# Patient Record
Sex: Female | Born: 1980 | Race: Black or African American | Hispanic: No | Marital: Married | Smoking: Never smoker
Health system: Southern US, Community
[De-identification: ages and names within clinical notes are randomized; demographics above are authoritative.]

## PROBLEM LIST (undated history)

## (undated) DIAGNOSIS — O24419 Gestational diabetes mellitus in pregnancy, unspecified control: Secondary | ICD-10-CM

## (undated) DIAGNOSIS — Z8619 Personal history of other infectious and parasitic diseases: Secondary | ICD-10-CM

## (undated) DIAGNOSIS — G43909 Migraine, unspecified, not intractable, without status migrainosus: Secondary | ICD-10-CM

## (undated) DIAGNOSIS — Z21 Asymptomatic human immunodeficiency virus [HIV] infection status: Secondary | ICD-10-CM

## (undated) DIAGNOSIS — B20 Human immunodeficiency virus [HIV] disease: Secondary | ICD-10-CM

## (undated) HISTORY — DX: Asymptomatic human immunodeficiency virus (hiv) infection status: Z21

## (undated) HISTORY — DX: Migraine, unspecified, not intractable, without status migrainosus: G43.909

## (undated) HISTORY — DX: Gestational diabetes mellitus in pregnancy, unspecified control: O24.419

## (undated) HISTORY — DX: Personal history of other infectious and parasitic diseases: Z86.19

## (undated) HISTORY — DX: Human immunodeficiency virus (HIV) disease: B20

## (undated) HISTORY — PX: NO PAST SURGERIES: SHX2092

---

## 2005-06-25 ENCOUNTER — Emergency Department (HOSPITAL_COMMUNITY): Admission: EM | Admit: 2005-06-25 | Discharge: 2005-06-26 | Payer: Self-pay | Admitting: Emergency Medicine

## 2005-07-02 ENCOUNTER — Ambulatory Visit: Payer: Self-pay | Admitting: Sports Medicine

## 2005-10-21 ENCOUNTER — Emergency Department (HOSPITAL_COMMUNITY): Admission: EM | Admit: 2005-10-21 | Discharge: 2005-10-21 | Payer: Self-pay | Admitting: Emergency Medicine

## 2006-05-22 DIAGNOSIS — G43909 Migraine, unspecified, not intractable, without status migrainosus: Secondary | ICD-10-CM | POA: Insufficient documentation

## 2006-09-04 ENCOUNTER — Ambulatory Visit: Payer: Self-pay | Admitting: Obstetrics & Gynecology

## 2006-09-18 ENCOUNTER — Ambulatory Visit: Payer: Self-pay | Admitting: Family Medicine

## 2006-09-19 ENCOUNTER — Ambulatory Visit: Payer: Self-pay | Admitting: Obstetrics and Gynecology

## 2006-09-24 ENCOUNTER — Ambulatory Visit (HOSPITAL_COMMUNITY): Admission: RE | Admit: 2006-09-24 | Discharge: 2006-09-24 | Payer: Self-pay | Admitting: Obstetrics and Gynecology

## 2006-09-25 ENCOUNTER — Ambulatory Visit: Payer: Self-pay | Admitting: Family Medicine

## 2006-10-02 ENCOUNTER — Ambulatory Visit: Payer: Self-pay | Admitting: Family Medicine

## 2006-10-03 ENCOUNTER — Telehealth: Payer: Self-pay | Admitting: Internal Medicine

## 2006-10-16 ENCOUNTER — Ambulatory Visit: Payer: Self-pay | Admitting: Family Medicine

## 2006-10-28 ENCOUNTER — Encounter: Admission: RE | Admit: 2006-10-28 | Discharge: 2006-10-28 | Payer: Self-pay | Admitting: Internal Medicine

## 2006-10-28 ENCOUNTER — Ambulatory Visit: Payer: Self-pay | Admitting: Internal Medicine

## 2006-10-28 DIAGNOSIS — B977 Papillomavirus as the cause of diseases classified elsewhere: Secondary | ICD-10-CM

## 2006-10-28 DIAGNOSIS — B20 Human immunodeficiency virus [HIV] disease: Secondary | ICD-10-CM | POA: Insufficient documentation

## 2006-10-28 DIAGNOSIS — B009 Herpesviral infection, unspecified: Secondary | ICD-10-CM | POA: Insufficient documentation

## 2006-10-28 LAB — CONVERTED CEMR LAB
ALT: 33 units/L (ref 0–35)
AST: 16 units/L (ref 0–37)
Albumin: 3.7 g/dL (ref 3.5–5.2)
Alkaline Phosphatase: 86 units/L (ref 39–117)
BUN: 7 mg/dL (ref 6–23)
Basophils Absolute: 0 10*3/uL (ref 0.0–0.1)
CO2: 23 meq/L (ref 19–32)
Calcium: 8.8 mg/dL (ref 8.4–10.5)
Chloride: 104 meq/L (ref 96–112)
Creatinine, Ser: 0.63 mg/dL (ref 0.40–1.20)
Glucose, Bld: 85 mg/dL (ref 70–99)
HCT: 34.4 % — ABNORMAL LOW (ref 36.0–46.0)
Hemoglobin: 11.4 g/dL — ABNORMAL LOW (ref 12.0–15.0)
Lymphocytes Relative: 17 % (ref 12–46)
MCHC: 33.1 g/dL (ref 30.0–36.0)
MCV: 93.2 fL (ref 78.0–100.0)
Monocytes Relative: 8 % (ref 3–11)
Neutrophils Relative %: 74 % (ref 43–77)
RBC: 3.69 M/uL — ABNORMAL LOW (ref 3.87–5.11)
RDW: 14.2 % — ABNORMAL HIGH (ref 11.5–14.0)
Sodium: 137 meq/L (ref 135–145)
Total Bilirubin: 0.5 mg/dL (ref 0.3–1.2)
Total Protein: 7 g/dL (ref 6.0–8.3)

## 2006-10-29 ENCOUNTER — Encounter: Payer: Self-pay | Admitting: Internal Medicine

## 2006-11-02 ENCOUNTER — Encounter: Payer: Self-pay | Admitting: Internal Medicine

## 2006-11-13 ENCOUNTER — Ambulatory Visit: Payer: Self-pay | Admitting: Obstetrics & Gynecology

## 2006-12-04 ENCOUNTER — Ambulatory Visit: Payer: Self-pay | Admitting: Obstetrics & Gynecology

## 2006-12-08 ENCOUNTER — Ambulatory Visit: Payer: Self-pay | Admitting: Obstetrics & Gynecology

## 2006-12-12 ENCOUNTER — Inpatient Hospital Stay (HOSPITAL_COMMUNITY): Admission: AD | Admit: 2006-12-12 | Discharge: 2006-12-12 | Payer: Self-pay | Admitting: Gynecology

## 2006-12-15 ENCOUNTER — Ambulatory Visit: Payer: Self-pay | Admitting: Obstetrics & Gynecology

## 2006-12-19 ENCOUNTER — Ambulatory Visit: Payer: Self-pay | Admitting: Internal Medicine

## 2006-12-29 ENCOUNTER — Ambulatory Visit (HOSPITAL_COMMUNITY): Admission: RE | Admit: 2006-12-29 | Discharge: 2006-12-29 | Payer: Self-pay | Admitting: Gynecology

## 2006-12-29 ENCOUNTER — Ambulatory Visit: Payer: Self-pay | Admitting: Obstetrics & Gynecology

## 2007-01-02 ENCOUNTER — Ambulatory Visit: Payer: Self-pay | Admitting: Internal Medicine

## 2007-01-08 ENCOUNTER — Ambulatory Visit: Payer: Self-pay | Admitting: Obstetrics & Gynecology

## 2007-01-12 ENCOUNTER — Ambulatory Visit: Payer: Self-pay | Admitting: Obstetrics & Gynecology

## 2007-01-12 ENCOUNTER — Encounter: Admission: RE | Admit: 2007-01-12 | Discharge: 2007-01-12 | Payer: Self-pay | Admitting: Obstetrics & Gynecology

## 2007-01-19 ENCOUNTER — Ambulatory Visit: Payer: Self-pay | Admitting: Family Medicine

## 2007-01-26 ENCOUNTER — Ambulatory Visit: Payer: Self-pay | Admitting: Obstetrics & Gynecology

## 2007-01-27 ENCOUNTER — Ambulatory Visit: Payer: Self-pay | Admitting: Obstetrics and Gynecology

## 2007-01-27 ENCOUNTER — Inpatient Hospital Stay (HOSPITAL_COMMUNITY): Admission: AD | Admit: 2007-01-27 | Discharge: 2007-01-27 | Payer: Self-pay | Admitting: Gynecology

## 2007-02-02 ENCOUNTER — Encounter: Admission: RE | Admit: 2007-02-02 | Discharge: 2007-02-02 | Payer: Self-pay | Admitting: Internal Medicine

## 2007-02-02 ENCOUNTER — Ambulatory Visit: Payer: Self-pay | Admitting: Internal Medicine

## 2007-02-02 ENCOUNTER — Ambulatory Visit: Payer: Self-pay | Admitting: Obstetrics & Gynecology

## 2007-02-02 LAB — CONVERTED CEMR LAB
ALT: 74 units/L — ABNORMAL HIGH (ref 0–35)
AST: 38 units/L — ABNORMAL HIGH (ref 0–37)
Albumin: 3.3 g/dL — ABNORMAL LOW (ref 3.5–5.2)
Alkaline Phosphatase: 208 units/L — ABNORMAL HIGH (ref 39–117)
BUN: 9 mg/dL (ref 6–23)
Basophils Absolute: 0 10*3/uL (ref 0.0–0.1)
CO2: 21 meq/L (ref 19–32)
Chloride: 102 meq/L (ref 96–112)
Eosinophils Relative: 1 % (ref 0–5)
Lymphocytes Relative: 27 % (ref 12–46)
Lymphs Abs: 1.9 10*3/uL (ref 0.7–4.0)
MCHC: 32 g/dL (ref 30.0–36.0)
Monocytes Absolute: 0.7 10*3/uL (ref 0.1–1.0)
Monocytes Relative: 10 % (ref 3–12)
Neutro Abs: 4.5 10*3/uL (ref 1.7–7.7)
Neutrophils Relative %: 62 % (ref 43–77)
Total Bilirubin: 0.4 mg/dL (ref 0.3–1.2)
WBC: 7.2 10*3/uL (ref 4.0–10.5)

## 2007-02-09 ENCOUNTER — Ambulatory Visit (HOSPITAL_COMMUNITY): Admission: RE | Admit: 2007-02-09 | Discharge: 2007-02-09 | Payer: Self-pay | Admitting: Obstetrics and Gynecology

## 2007-02-09 ENCOUNTER — Ambulatory Visit: Payer: Self-pay | Admitting: Obstetrics & Gynecology

## 2007-02-14 ENCOUNTER — Inpatient Hospital Stay (HOSPITAL_COMMUNITY): Admission: AD | Admit: 2007-02-14 | Discharge: 2007-02-16 | Payer: Self-pay | Admitting: Gynecology

## 2007-02-14 ENCOUNTER — Ambulatory Visit: Payer: Self-pay | Admitting: Obstetrics and Gynecology

## 2007-02-18 ENCOUNTER — Ambulatory Visit: Payer: Self-pay | Admitting: Internal Medicine

## 2007-02-20 ENCOUNTER — Emergency Department (HOSPITAL_COMMUNITY): Admission: EM | Admit: 2007-02-20 | Discharge: 2007-02-20 | Payer: Self-pay | Admitting: Family Medicine

## 2007-02-23 ENCOUNTER — Ambulatory Visit: Payer: Self-pay | Admitting: Obstetrics and Gynecology

## 2007-02-23 ENCOUNTER — Inpatient Hospital Stay (HOSPITAL_COMMUNITY): Admission: AD | Admit: 2007-02-23 | Discharge: 2007-02-23 | Payer: Self-pay | Admitting: Obstetrics and Gynecology

## 2007-02-25 ENCOUNTER — Encounter: Payer: Self-pay | Admitting: Internal Medicine

## 2007-03-18 ENCOUNTER — Encounter: Payer: Self-pay | Admitting: Internal Medicine

## 2007-05-06 ENCOUNTER — Ambulatory Visit: Payer: Self-pay | Admitting: Obstetrics & Gynecology

## 2007-05-06 ENCOUNTER — Encounter (INDEPENDENT_AMBULATORY_CARE_PROVIDER_SITE_OTHER): Payer: Self-pay | Admitting: *Deleted

## 2007-05-06 ENCOUNTER — Other Ambulatory Visit: Admission: RE | Admit: 2007-05-06 | Discharge: 2007-05-06 | Payer: Self-pay | Admitting: Family Medicine

## 2007-05-06 ENCOUNTER — Encounter: Payer: Self-pay | Admitting: Obstetrics & Gynecology

## 2007-05-06 ENCOUNTER — Encounter: Payer: Self-pay | Admitting: Internal Medicine

## 2007-05-11 ENCOUNTER — Ambulatory Visit: Payer: Self-pay | Admitting: Internal Medicine

## 2007-05-11 ENCOUNTER — Encounter: Admission: RE | Admit: 2007-05-11 | Discharge: 2007-05-11 | Payer: Self-pay | Admitting: Internal Medicine

## 2007-05-11 LAB — CONVERTED CEMR LAB
ALT: 28 units/L (ref 0–35)
BUN: 10 mg/dL (ref 6–23)
Calcium: 9.2 mg/dL (ref 8.4–10.5)
Chloride: 106 meq/L (ref 96–112)
Creatinine, Ser: 0.79 mg/dL (ref 0.40–1.20)
Eosinophils Relative: 3 % (ref 0–5)
Glucose, Bld: 100 mg/dL — ABNORMAL HIGH (ref 70–99)
HCT: 40.6 % (ref 36.0–46.0)
HIV 1 RNA Quant: 5880 copies/mL — ABNORMAL HIGH (ref <50)
HIV-1 RNA Quant, Log: 3.77 — ABNORMAL HIGH (ref <1.70)
Hemoglobin: 13.2 g/dL (ref 12.0–15.0)
Lymphs Abs: 1.6 10*3/uL (ref 0.7–4.0)
Neutro Abs: 3 10*3/uL (ref 1.7–7.7)
Neutrophils Relative %: 56 % (ref 43–77)
Platelets: 339 10*3/uL (ref 150–400)
Potassium: 4 meq/L (ref 3.5–5.3)
RDW: 13 % (ref 11.5–15.5)
WBC: 5.3 10*3/uL (ref 4.0–10.5)

## 2007-05-19 ENCOUNTER — Encounter (INDEPENDENT_AMBULATORY_CARE_PROVIDER_SITE_OTHER): Payer: Self-pay | Admitting: *Deleted

## 2007-05-22 ENCOUNTER — Ambulatory Visit: Payer: Self-pay | Admitting: Internal Medicine

## 2007-05-22 ENCOUNTER — Encounter: Payer: Self-pay | Admitting: Licensed Clinical Social Worker

## 2007-05-29 ENCOUNTER — Ambulatory Visit: Payer: Self-pay | Admitting: Gynecology

## 2007-08-20 ENCOUNTER — Ambulatory Visit: Payer: Self-pay | Admitting: Internal Medicine

## 2007-08-20 ENCOUNTER — Encounter: Admission: RE | Admit: 2007-08-20 | Discharge: 2007-08-20 | Payer: Self-pay | Admitting: Internal Medicine

## 2007-08-20 LAB — CONVERTED CEMR LAB
AST: 56 units/L — ABNORMAL HIGH (ref 0–37)
Albumin: 4.5 g/dL (ref 3.5–5.2)
Alkaline Phosphatase: 83 units/L (ref 39–117)
CO2: 22 meq/L (ref 19–32)
Calcium: 9.2 mg/dL (ref 8.4–10.5)
Eosinophils Absolute: 0 10*3/uL (ref 0.0–0.7)
Eosinophils Relative: 1 % (ref 0–5)
HCT: 39.9 % (ref 36.0–46.0)
HIV-1 RNA Quant, Log: 3.71 — ABNORMAL HIGH (ref <1.70)
Hemoglobin: 12.4 g/dL (ref 12.0–15.0)
Lymphocytes Relative: 45 % (ref 12–46)
Lymphs Abs: 1.8 10*3/uL (ref 0.7–4.0)
Neutrophils Relative %: 45 % (ref 43–77)
Potassium: 4.2 meq/L (ref 3.5–5.3)
RBC: 4.4 M/uL (ref 3.87–5.11)
Sodium: 137 meq/L (ref 135–145)
Total Protein: 7.9 g/dL (ref 6.0–8.3)
WBC: 4 10*3/uL (ref 4.0–10.5)

## 2007-11-26 ENCOUNTER — Encounter: Payer: Self-pay | Admitting: Internal Medicine

## 2010-12-14 LAB — POCT PREGNANCY, URINE
Operator id: 148111
Preg Test, Ur: NEGATIVE

## 2010-12-31 LAB — URINALYSIS, ROUTINE W REFLEX MICROSCOPIC
Bilirubin Urine: NEGATIVE
Ketones, ur: NEGATIVE
pH: 6

## 2010-12-31 LAB — URINE MICROSCOPIC-ADD ON

## 2011-01-01 LAB — URINE CULTURE
Colony Count: NO GROWTH
Culture: NO GROWTH

## 2011-01-01 LAB — POCT URINALYSIS DIP (DEVICE)
Bilirubin Urine: NEGATIVE
Bilirubin Urine: NEGATIVE
Bilirubin Urine: NEGATIVE
Glucose, UA: NEGATIVE
Glucose, UA: NEGATIVE
Glucose, UA: NEGATIVE
Glucose, UA: NEGATIVE
Hgb urine dipstick: NEGATIVE
Ketones, ur: NEGATIVE
Ketones, ur: NEGATIVE
Nitrite: NEGATIVE
Nitrite: NEGATIVE
Nitrite: NEGATIVE
Operator id: 120861
Operator id: 208841
Operator id: 194561
Operator id: 297281
Protein, ur: 100 — AB
Protein, ur: NEGATIVE
Specific Gravity, Urine: 1.015
Specific Gravity, Urine: 1.015
Urobilinogen, UA: 0.2
Urobilinogen, UA: 1
pH: 6.5

## 2011-01-01 LAB — CBC
Hemoglobin: 10.6 — ABNORMAL LOW
MCV: 95.3
Platelets: 218
RBC: 3.23 — ABNORMAL LOW
RBC: 3.64 — ABNORMAL LOW
RDW: 14.4
WBC: 11.4 — ABNORMAL HIGH

## 2011-01-01 LAB — T-HELPER CELL (CD4) - (RCID CLINIC ONLY)
CD4 % Helper T Cell: 46
CD4 T Cell Abs: 840

## 2011-01-02 LAB — POCT URINALYSIS DIP (DEVICE)
Ketones, ur: NEGATIVE
Ketones, ur: NEGATIVE
Nitrite: NEGATIVE
Nitrite: NEGATIVE
Nitrite: NEGATIVE
Operator id: 134861
Operator id: 297281
Operator id: 297281
Protein, ur: NEGATIVE
Protein, ur: NEGATIVE
Protein, ur: NEGATIVE
Urobilinogen, UA: 0.2
Urobilinogen, UA: 0.2
pH: 7
pH: 7

## 2011-01-03 LAB — URINALYSIS, ROUTINE W REFLEX MICROSCOPIC
Bilirubin Urine: NEGATIVE
Ketones, ur: NEGATIVE
Nitrite: NEGATIVE
Protein, ur: NEGATIVE
pH: 7

## 2011-01-03 LAB — POCT URINALYSIS DIP (DEVICE)
Operator id: 194561
Protein, ur: NEGATIVE
Urobilinogen, UA: 0.2
pH: 7

## 2011-01-03 LAB — URINE MICROSCOPIC-ADD ON: RBC / HPF: NONE SEEN

## 2011-01-04 LAB — POCT URINALYSIS DIP (DEVICE)
Glucose, UA: NEGATIVE
Specific Gravity, Urine: 1.02
Urobilinogen, UA: 1
pH: 6.5

## 2011-01-07 LAB — POCT URINALYSIS DIP (DEVICE)
Nitrite: NEGATIVE
Urobilinogen, UA: 1

## 2011-01-07 LAB — T-HELPER CELL (CD4) - (RCID CLINIC ONLY): CD4 % Helper T Cell: 44

## 2011-01-08 LAB — POCT URINALYSIS DIP (DEVICE)
Glucose, UA: NEGATIVE
Glucose, UA: NEGATIVE
Hgb urine dipstick: NEGATIVE
Nitrite: NEGATIVE
Nitrite: NEGATIVE
Protein, ur: NEGATIVE
Specific Gravity, Urine: 1.01
Urobilinogen, UA: 0.2
Urobilinogen, UA: 1
pH: 5.5

## 2011-01-09 LAB — POCT URINALYSIS DIP (DEVICE)
Nitrite: NEGATIVE
Protein, ur: NEGATIVE
Specific Gravity, Urine: 1.02
Urobilinogen, UA: 0.2
pH: 7

## 2011-01-10 LAB — POCT URINALYSIS DIP (DEVICE)
Nitrite: NEGATIVE
Specific Gravity, Urine: 1.02
Urobilinogen, UA: 1
pH: 6.5

## 2012-04-01 ENCOUNTER — Other Ambulatory Visit (INDEPENDENT_AMBULATORY_CARE_PROVIDER_SITE_OTHER): Payer: BC Managed Care – PPO

## 2012-04-01 ENCOUNTER — Encounter: Payer: Self-pay | Admitting: Internal Medicine

## 2012-04-01 ENCOUNTER — Telehealth: Payer: Self-pay | Admitting: *Deleted

## 2012-04-01 ENCOUNTER — Ambulatory Visit (INDEPENDENT_AMBULATORY_CARE_PROVIDER_SITE_OTHER): Payer: BC Managed Care – PPO | Admitting: Internal Medicine

## 2012-04-01 VITALS — BP 124/86 | HR 80 | Temp 98.1°F | Ht 65.0 in | Wt 144.0 lb

## 2012-04-01 DIAGNOSIS — Z21 Asymptomatic human immunodeficiency virus [HIV] infection status: Secondary | ICD-10-CM

## 2012-04-01 DIAGNOSIS — Z0000 Encounter for general adult medical examination without abnormal findings: Secondary | ICD-10-CM

## 2012-04-01 DIAGNOSIS — Z1329 Encounter for screening for other suspected endocrine disorder: Secondary | ICD-10-CM

## 2012-04-01 DIAGNOSIS — G43009 Migraine without aura, not intractable, without status migrainosus: Secondary | ICD-10-CM

## 2012-04-01 DIAGNOSIS — Z130 Encounter for screening for diseases of the blood and blood-forming organs and certain disorders involving the immune mechanism: Secondary | ICD-10-CM

## 2012-04-01 DIAGNOSIS — Z13220 Encounter for screening for lipoid disorders: Secondary | ICD-10-CM

## 2012-04-01 DIAGNOSIS — Z131 Encounter for screening for diabetes mellitus: Secondary | ICD-10-CM

## 2012-04-01 DIAGNOSIS — B20 Human immunodeficiency virus [HIV] disease: Secondary | ICD-10-CM

## 2012-04-01 LAB — HEMOGLOBIN A1C: Hgb A1c MFr Bld: 5.2 % (ref 4.6–6.5)

## 2012-04-01 LAB — CBC
HCT: 33.9 % — ABNORMAL LOW (ref 36.0–46.0)
Hemoglobin: 11.6 g/dL — ABNORMAL LOW (ref 12.0–15.0)
MCHC: 34.2 g/dL (ref 30.0–36.0)
MCV: 88 fl (ref 78.0–100.0)
Platelets: 279 10*3/uL (ref 150.0–400.0)
RDW: 12.7 % (ref 11.5–14.6)

## 2012-04-01 LAB — BASIC METABOLIC PANEL
BUN: 10 mg/dL (ref 6–23)
Calcium: 8.8 mg/dL (ref 8.4–10.5)
Chloride: 101 mEq/L (ref 96–112)
Creatinine, Ser: 0.6 mg/dL (ref 0.4–1.2)

## 2012-04-01 LAB — LIPID PANEL
Cholesterol: 155 mg/dL (ref 0–200)
LDL Cholesterol: 88 mg/dL (ref 0–99)
Triglycerides: 112 mg/dL (ref 0.0–149.0)

## 2012-04-01 MED ORDER — SUMATRIPTAN SUCCINATE 50 MG PO TABS: 50.0000 mg | ORAL_TABLET | ORAL | Status: DC | PRN

## 2012-04-01 NOTE — Telephone Encounter (Signed)
Pt informed of lab results via VM and to callback office with any questions/concerns (letter also mailed to pt).     Ash,  Can you please send this to Mrs. Lacap and also call and let her know that all of her labs were essentially normal.  Rene Kocher

## 2012-04-01 NOTE — Patient Instructions (Signed)
Health Maintenance, Females A healthy lifestyle and preventative care can promote health and wellness.  Maintain regular health, dental, and eye exams.  Eat a healthy diet. Foods like vegetables, fruits, whole grains, low-fat dairy products, and lean protein foods contain the nutrients you need without too many calories. Decrease your intake of foods high in solid fats, added sugars, and salt. Get information about a proper diet from your caregiver, if necessary.  Regular physical exercise is one of the most important things you can do for your health. Most adults should get at least 150 minutes of moderate-intensity exercise (any activity that increases your heart rate and causes you to sweat) each week. In addition, most adults need muscle-strengthening exercises on 2 or more days a week.   Maintain a healthy weight. The body mass index (BMI) is a screening tool to identify possible weight problems. It provides an estimate of body fat based on height and weight. Your caregiver can help determine your BMI, and can help you achieve or maintain a healthy weight. For adults 20 years and older:  A BMI below 18.5 is considered underweight.  A BMI of 18.5 to 24.9 is normal.  A BMI of 25 to 29.9 is considered overweight.  A BMI of 30 and above is considered obese.  Maintain normal blood lipids and cholesterol by exercising and minimizing your intake of saturated fat. Eat a balanced diet with plenty of fruits and vegetables. Blood tests for lipids and cholesterol should begin at age 20 and be repeated every 5 years. If your lipid or cholesterol levels are high, you are over 50, or you are a high risk for heart disease, you may need your cholesterol levels checked more frequently.Ongoing high lipid and cholesterol levels should be treated with medicines if diet and exercise are not effective.  If you smoke, find out from your caregiver how to quit. If you do not use tobacco, do not start.  If you  are pregnant, do not drink alcohol. If you are breastfeeding, be very cautious about drinking alcohol. If you are not pregnant and choose to drink alcohol, do not exceed 1 drink per day. One drink is considered to be 12 ounces (355 mL) of beer, 5 ounces (148 mL) of wine, or 1.5 ounces (44 mL) of liquor.  Avoid use of street drugs. Do not share needles with anyone. Ask for help if you need support or instructions about stopping the use of drugs.  High blood pressure causes heart disease and increases the risk of stroke. Blood pressure should be checked at least every 1 to 2 years. Ongoing high blood pressure should be treated with medicines, if weight loss and exercise are not effective.  If you are 55 to 30 years old, ask your caregiver if you should take aspirin to prevent strokes.  Diabetes screening involves taking a blood sample to check your fasting blood sugar level. This should be done once every 3 years, after age 45, if you are within normal weight and without risk factors for diabetes. Testing should be considered at a younger age or be carried out more frequently if you are overweight and have at least 1 risk factor for diabetes.  Breast cancer screening is essential preventative care for women. You should practice "breast self-awareness." This means understanding the normal appearance and feel of your breasts and may include breast self-examination. Any changes detected, no matter how small, should be reported to a caregiver. Women in their 20s and 30s should have   a clinical breast exam (CBE) by a caregiver as part of a regular health exam every 1 to 3 years. After age 40, women should have a CBE every year. Starting at age 40, women should consider having a mammogram (breast X-ray) every year. Women who have a family history of breast cancer should talk to their caregiver about genetic screening. Women at a high risk of breast cancer should talk to their caregiver about having an MRI and a  mammogram every year.  The Pap test is a screening test for cervical cancer. Women should have a Pap test starting at age 21. Between ages 21 and 29, Pap tests should be repeated every 2 years. Beginning at age 30, you should have a Pap test every 3 years as long as the past 3 Pap tests have been normal. If you had a hysterectomy for a problem that was not cancer or a condition that could lead to cancer, then you no longer need Pap tests. If you are between ages 65 and 70, and you have had normal Pap tests going back 10 years, you no longer need Pap tests. If you have had past treatment for cervical cancer or a condition that could lead to cancer, you need Pap tests and screening for cancer for at least 20 years after your treatment. If Pap tests have been discontinued, risk factors (such as a new sexual partner) need to be reassessed to determine if screening should be resumed. Some women have medical problems that increase the chance of getting cervical cancer. In these cases, your caregiver may recommend more frequent screening and Pap tests.  The human papillomavirus (HPV) test is an additional test that may be used for cervical cancer screening. The HPV test looks for the virus that can cause the cell changes on the cervix. The cells collected during the Pap test can be tested for HPV. The HPV test could be used to screen women aged 30 years and older, and should be used in women of any age who have unclear Pap test results. After the age of 30, women should have HPV testing at the same frequency as a Pap test.  Colorectal cancer can be detected and often prevented. Most routine colorectal cancer screening begins at the age of 50 and continues through age 75. However, your caregiver may recommend screening at an earlier age if you have risk factors for colon cancer. On a yearly basis, your caregiver may provide home test kits to check for hidden blood in the stool. Use of a small camera at the end of a  tube, to directly examine the colon (sigmoidoscopy or colonoscopy), can detect the earliest forms of colorectal cancer. Talk to your caregiver about this at age 50, when routine screening begins. Direct examination of the colon should be repeated every 5 to 10 years through age 75, unless early forms of pre-cancerous polyps or small growths are found.  Hepatitis C blood testing is recommended for all people born from 1945 through 1965 and any individual with known risks for hepatitis C.  Practice safe sex. Use condoms and avoid high-risk sexual practices to reduce the spread of sexually transmitted infections (STIs). Sexually active women aged 25 and younger should be checked for Chlamydia, which is a common sexually transmitted infection. Older women with new or multiple partners should also be tested for Chlamydia. Testing for other STIs is recommended if you are sexually active and at increased risk.  Osteoporosis is a disease in which the   bones lose minerals and strength with aging. This can result in serious bone fractures. The risk of osteoporosis can be identified using a bone density scan. Women ages 65 and over and women at risk for fractures or osteoporosis should discuss screening with their caregivers. Ask your caregiver whether you should be taking a calcium supplement or vitamin D to reduce the rate of osteoporosis.  Menopause can be associated with physical symptoms and risks. Hormone replacement therapy is available to decrease symptoms and risks. You should talk to your caregiver about whether hormone replacement therapy is right for you.  Use sunscreen with a sun protection factor (SPF) of 30 or greater. Apply sunscreen liberally and repeatedly throughout the day. You should seek shade when your shadow is shorter than you. Protect yourself by wearing long sleeves, pants, a wide-brimmed hat, and sunglasses year round, whenever you are outdoors.  Notify your caregiver of new moles or  changes in moles, especially if there is a change in shape or color. Also notify your caregiver if a mole is larger than the size of a pencil eraser.  Stay current with your immunizations. Document Released: 09/24/2010 Document Revised: 06/03/2011 Document Reviewed: 09/24/2010 ExitCare Patient Information 2013 [employer]. Breast Self-Exam A self breast exam may help you find changes or problems while they are still small. Do a breast self-exam:  Every month.  One week after your period (menstrual period).  On the first day of each month if you do not have periods anymore. Look for any:  Change in breast color, size, or shape.  Dimples in your breast.  Changes in your nipples or skin.  Dry skin on your breasts or nipples.  Watery or bloody discharge from your nipples.  Feel for:  Lumps.  Thick, hard places.  Any other changes. HOME CARE There are 3 ways to do the breast self-exam: In front of a mirror.  Lift your arms over your head and turn side to side.  Put your hands on your hips and lean down, then turn from side to side.  Bend forward and turn from side to side. In the shower.  With soapy hands, check both breasts. Then check above and below your collarbone and your armpits.  Feel above and below your collarbone down to under your breast, and from the center of your chest to the outer edge of the armpit. Check for any lumps or hard spots.  Using the tips of your middle three fingers check your whole breast by pressing your hand over your breast in a circle or in an up and down motion. Lying down.  Lie flat on your bed.  Put a small pillow under the breast you are going to check. On that same side, put your hand behind your head.  With your other hand, use the 3 middle fingers to feel the breast.  Move your fingers in a circle around the breast. Press firmly over all parts of the breast to feel for any lumps. GET HELP RIGHT AWAY IF: You find any  changes in your breasts so they can be checked. Document Released: 08/28/2007 Document Revised: 06/03/2011 Document Reviewed: 06/29/2008 Exit[employer] Patient Information 2013 [employer].  

## 2012-04-01 NOTE — Progress Notes (Signed)
HPI  Pt presents to the clinic today to establish care. Diane Lowery is accompanied by her husband who is her primary support person. Diane Lowery has not seen a PCP in a number of years. Diane Lowery does have some concerns today. 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 Diane Lowery needs to get set up with an ID doctor in this area who can check her labs and let her know if Diane Lowery needs to start antiviral treatment. 2- Diane Lowery has been having headaches every day since Mar 02, 2012. Diane Lowery has gone to UC and the ER for this. They did a CT scan which was negative. They gave her Valium, which did not help. The headache are on the right side of her head only. They start in her neck and work there way up. Diane Lowery does have pain behind her right eye. Diane Lowery does have right eye watering and right nares rinorrhea. Diane Lowery does not have an aura. Diane Lowery does have nausea without vomiting. Diane Lowery uses Bayer Migraine and cold compresses and sleeps it off which seems to help.  LMP: 03/21/2012 Pap Smear: unknown when last one was Flu: never Tdap: never  Past Medical History  Diagnosis Date  . Migraines   . HIV infection   . History of chicken pox     No current outpatient prescriptions on file.    No Known Allergies  Family History  Problem Relation Age of Onset  . Stroke Maternal Aunt   . Hypertension Mother   . Hypertension Sister   . Cancer Neg Hx   . Diabetes Neg Hx   . Early death Neg Hx   . Hyperlipidemia Neg Hx     History   Social History  . Marital Status: Married    Spouse Name: N/A    Number of Children: 1  . Years of Education: 12+   Occupational History  . Customer Service    Social History Main Topics  . Smoking status: Never Smoker   . Smokeless tobacco: Never Used  . Alcohol Use: 1.8 oz/week    1 Glasses of wine, 1 Cans of beer, 1 Shots of liquor per week  . Drug Use: No  . Sexually Active: Yes    Birth Control/ Protection: None   Other Topics Concern  . Not on file   Social History  Narrative   Regular exercise-noCaffeine Use-yes    ROS:  Constitutional: Pt reports headache. Denies fever, malaise, fatigue, or abrupt weight changes.  HEENT: Denies eye pain, eye redness, ear pain, ringing in the ears, wax buildup, runny nose, nasal congestion, bloody nose, or sore throat. Respiratory: Denies difficulty breathing, shortness of breath, cough or sputum production.   Cardiovascular: Denies chest pain, chest tightness, palpitations or swelling in the hands or feet.  Gastrointestinal: Denies abdominal pain, bloating, constipation, diarrhea or blood in the stool.  GU: Denies frequency, urgency, pain with urination, blood in urine, odor or discharge. Musculoskeletal: Denies decrease in range of motion, difficulty with gait, muscle pain or joint pain and swelling.  Skin: Denies redness, rashes, lesions or ulcercations.  Neurological: Denies dizziness, difficulty with memory, difficulty with speech or problems with balance and coordination.   No other specific complaints in a complete review of systems (except as listed in HPI above).  PE:  BP 124/86  Pulse 80  Temp 98.1 F (36.7 C) (Oral)  Ht 5\' 5"  (1.651 m)  Wt 144 lb (65.318 kg)  BMI 23.96 kg/m2  SpO2 99%  LMP 03/21/2012 Wt Readings from Last 3 Encounters:  04/01/12 144 lb (65.318 kg)  05/22/07 154 lb 6.4 oz (70.035 kg)  02/18/07 165 lb 1 oz (74.872 kg)    General: Appears her stated age, well developed, well nourished in NAD. HEENT: Head: normal shape and size; Eyes: sclera white, no icterus, conjunctiva pink, PERRLA and EOMs intact; Ears: Tm's gray and intact, normal light reflex; Nose: mucosa pink and moist, septum midline; Throat/Mouth: Teeth present, mucosa pink and moist, no lesions or ulcerations noted.  Neck: Normal range of motion. Neck supple, trachea midline. No massses, lumps or thyromegaly present.  Cardiovascular: Normal rate and rhythm. S1,S2 noted.  No murmur, rubs or gallops noted. No JVD or BLE  edema. No carotid bruits noted. Pulmonary/Chest: Normal effort and positive vesicular breath sounds. No respiratory distress. No wheezes, rales or ronchi noted.  Abdomen: Soft and nontender. Normal bowel sounds, no bruits noted. No distention or masses noted. Liver, spleen and kidneys non palpable. Musculoskeletal: Normal range of motion. No signs of joint swelling. No difficulty with gait.  Neurological: Alert and oriented. Cranial nerves II-XII intact. Coordination normal. +DTRs bilaterally. Psychiatric: Mood and affect normal. Behavior is normal. Judgment and thought content normal.    Assessment and Plan:  Preventative health Maintenance:  Pt declines flu and Tdap today Continue diet and exercise Will obtain basic screening labs today Call gyn and make appoint for pap smear  Migraine without aura:  Try Imitrex May need referral to neurology for further evaluation  HIV:  Will refer to ID for further workup and management  RTC in 1 year or sooner if needed

## 2012-04-16 ENCOUNTER — Ambulatory Visit (INDEPENDENT_AMBULATORY_CARE_PROVIDER_SITE_OTHER): Payer: BC Managed Care – PPO

## 2012-04-16 ENCOUNTER — Other Ambulatory Visit: Payer: Self-pay | Admitting: Internal Medicine

## 2012-04-16 DIAGNOSIS — F329 Major depressive disorder, single episode, unspecified: Secondary | ICD-10-CM

## 2012-04-16 DIAGNOSIS — Z23 Encounter for immunization: Secondary | ICD-10-CM

## 2012-04-16 DIAGNOSIS — B20 Human immunodeficiency virus [HIV] disease: Secondary | ICD-10-CM

## 2012-04-16 DIAGNOSIS — N979 Female infertility, unspecified: Secondary | ICD-10-CM

## 2012-04-16 LAB — COMPLETE METABOLIC PANEL WITH GFR
ALT: 39 U/L — ABNORMAL HIGH (ref 0–35)
AST: 36 U/L (ref 0–37)
Albumin: 4.2 g/dL (ref 3.5–5.2)
Calcium: 9 mg/dL (ref 8.4–10.5)
Chloride: 100 mEq/L (ref 96–112)
Potassium: 4 mEq/L (ref 3.5–5.3)
Sodium: 135 mEq/L (ref 135–145)

## 2012-04-16 LAB — HEPATITIS B SURFACE ANTIGEN: Hepatitis B Surface Ag: NEGATIVE

## 2012-04-16 LAB — RPR

## 2012-04-17 LAB — URINALYSIS
Leukocytes, UA: NEGATIVE
Nitrite: POSITIVE — AB
Protein, ur: NEGATIVE mg/dL
pH: 7 (ref 5.0–8.0)

## 2012-04-17 LAB — HEPATITIS B CORE ANTIBODY, TOTAL: Hep B Core Total Ab: NEGATIVE

## 2012-04-17 LAB — HEPATITIS A ANTIBODY, TOTAL: Hep A Total Ab: POSITIVE — AB

## 2012-04-17 LAB — HEPATITIS B SURFACE ANTIBODY,QUALITATIVE: Hep B S Ab: REACTIVE — AB

## 2012-04-20 NOTE — Progress Notes (Signed)
Pt has not been in care for HIV since birth of her daughter in 2008.  She refuses to take medications and thinks it is a reminder of her HIV. She admits being severely depressed due to this diagnosis and she would rather let the disease take its course rather than  receive treatment.  Patient states the stigma of HIV has such a depressing effect on her life. She does not feel motivated to do anything . When I asked about living for her daughter she replied "she would be well taken care of". She stated she would not harm herself or others but just would not do anything to prevent her death.   She states she is trying to conceive since she is newly married and her husband wants a child.  I asked why she would have another child if she did not feel like living and she stated because her husband wanted this. She does not want to loose her husband since he is the financial provider for her family. She never thought she would find a man to marry her with HIV and thinks  it would be hard to find someone else.  We discussed the issue of her taking HIV medications while she is pregnant and she stated she would take it during this period.   She has me very  concerned  and I do not feel comfortable allowing her to leave without speaking with our counselor.  I spent the majority of intake discussing her thoughts and feeling of being HIV positive and her reasons refusing treatment.   Her reason for being here today is due to the referral made by  Nicki Reaper, NP for treatment.  I feel she is seeking help with this problem and will assist with resources to provide positive reinforcement/support  for patient.  Pt is in agreement with the need for psychological assistance.    Franne Forts, counselor met with her briefly and scheduled an appointment for counseling on Apr 23, 2012.  After a discussion with Franne Forts I feel she is safe to be released.  Pt did commit to returning for counseling and appointment with physician.      Diane Lowery was a patient here during her pregnancy in 2008. Her name at the time was Diane Lowery.  Laurell Josephs, RN

## 2012-04-22 LAB — HIV-1 GENOTYPR PLUS

## 2012-04-23 ENCOUNTER — Ambulatory Visit: Payer: BC Managed Care – PPO

## 2012-04-30 ENCOUNTER — Ambulatory Visit (INDEPENDENT_AMBULATORY_CARE_PROVIDER_SITE_OTHER): Payer: BC Managed Care – PPO | Admitting: Internal Medicine

## 2012-04-30 ENCOUNTER — Encounter: Payer: Self-pay | Admitting: Internal Medicine

## 2012-04-30 VITALS — BP 136/84 | HR 83 | Temp 97.8°F | Wt 145.0 lb

## 2012-04-30 DIAGNOSIS — B20 Human immunodeficiency virus [HIV] disease: Secondary | ICD-10-CM

## 2012-04-30 DIAGNOSIS — Z21 Asymptomatic human immunodeficiency virus [HIV] infection status: Secondary | ICD-10-CM

## 2012-04-30 MED ORDER — SULFAMETHOXAZOLE-TMP DS 800-160 MG PO TABS: 1.0000 | ORAL_TABLET | Freq: Every day | ORAL | Status: DC

## 2012-04-30 MED ORDER — ELVITEG-COBIC-EMTRICIT-TENOFDF 150-150-200-300 MG PO TABS: 1.0000 | ORAL_TABLET | Freq: Every day | ORAL | Status: DC

## 2012-04-30 NOTE — Progress Notes (Signed)
[facility] NOTE  RFV: re-establishing care Subjective:    Patient ID: Diane Lowery, female    DOB: 07-29-1980, 32 y.o.   MRN: [id number]  HPI 32 yo F with HIV, CD 4 count of 140(24%)/VL 83,543 ( Jan 2014), has not been on cART since pregnancy in 2008, last #s in 2009 show CD 4 count 700s. Presents back in clinic to re-establish care. Moved back to the area last June 2013, with 5 yr old daughter to have her closer to her  daughter's birth-father. Still close to the family and her daughter's father. She is recently married in Sept - husband resides in [place] outside of [place]. When asked why she is getting back to care, her response,"my husband wanted me to". She and her new husband are trying to procreate in hopes of having a child.  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 She is currently waiting on unemployment.   Dx in 2007; decided to get tested ,in setting of donating plasma Started cART in 2008, in setting of pregnancy, daughter: HIV negative, finished  Taking cART a few weeks after giving birth  Current Outpatient Prescriptions on File Prior to Visit  Medication Sig Dispense Refill  . SUMAtriptan (IMITREX) 50 MG tablet Take 1 tablet (50 mg total) by mouth every 2 (two) hours as needed for migraine.  30 tablet  1   No current facility-administered medications on file prior to visit.   Active Ambulatory Problems    Diagnosis Date Noted  . HIV DISEASE 10/28/2006  . HSV 10/28/2006  . 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 MIGRAINE, UNSPEC., W/O INTRACTABLE MIGRAINE 05/22/2006   Resolved Ambulatory Problems    Diagnosis Date Noted  . No Resolved Ambulatory Problems   Past Medical History  Diagnosis Date  . Migraines   . HIV infection   . History of chicken pox      Review of Systems     Objective:   Physical Exam BP 136/84  Pulse 83  Temp 97.8 F (36.6 C) (Oral)  Wt 145 lb (65.772 kg)  LMP 04/15/2012 Physical Exam   Constitutional:  oriented to person, place, and time.  appears well-developed and well-nourished. No distress.  HENT:  Mouth/Throat: Oropharynx is clear and moist. No oropharyngeal exudate.  Cardiovascular: Normal rate, regular rhythm and normal heart sounds. Exam reveals no gallop and no friction rub.  No murmur heard.  Pulmonary/Chest: Effort normal and breath sounds normal. No respiratory distress. He has no wheezes.  Abdominal: Soft. Bowel sounds are normal. He exhibits no distension. There is no tenderness.  Lymphadenopathy:  has no cervical adenopathy.  Skin: Skin is warm and dry. No rash noted. No erythema.          Assessment & Plan:  HIV = will start her on stribild  OI proph = will start her on bactrim DS M-W-F  Health maintenance = up to date on vaccines  hiv prevention = use condoms, discussed that getting pregnant right now is very high risk for her husband getting hiv as well as concern that she is not on treatment and can have perinatal transmission. She agreed to start medication and discuss next steps at next appt.  Depression = she likely suffers from depression, would like to refer her to [facility] to start counseling. Discuss use of anti depressants at next visit  rtc in 1 mon

## 2012-05-28 ENCOUNTER — Encounter: Payer: Self-pay | Admitting: Internal Medicine

## 2012-05-28 ENCOUNTER — Ambulatory Visit (INDEPENDENT_AMBULATORY_CARE_PROVIDER_SITE_OTHER): Payer: BC Managed Care – PPO | Admitting: Internal Medicine

## 2012-05-28 VITALS — BP 121/80 | HR 76 | Temp 97.9°F | Ht 65.0 in | Wt 147.0 lb

## 2012-05-28 DIAGNOSIS — F329 Major depressive disorder, single episode, unspecified: Secondary | ICD-10-CM

## 2012-05-28 DIAGNOSIS — F3289 Other specified depressive episodes: Secondary | ICD-10-CM

## 2012-05-28 DIAGNOSIS — B20 Human immunodeficiency virus [HIV] disease: Secondary | ICD-10-CM

## 2012-05-28 LAB — CBC WITH DIFFERENTIAL/PLATELET
Basophils Relative: 1 % (ref 0–1)
Eosinophils Absolute: 0.6 10*3/uL (ref 0.0–0.7)
Eosinophils Relative: 15 % — ABNORMAL HIGH (ref 0–5)
HCT: 34.1 % — ABNORMAL LOW (ref 36.0–46.0)
Hemoglobin: 11.5 g/dL — ABNORMAL LOW (ref 12.0–15.0)
Lymphs Abs: 0.8 10*3/uL (ref 0.7–4.0)
Neutro Abs: 1.9 10*3/uL (ref 1.7–7.7)
Neutrophils Relative %: 51 % (ref 43–77)
RDW: 14 % (ref 11.5–15.5)
WBC: 3.8 10*3/uL — ABNORMAL LOW (ref 4.0–10.5)

## 2012-05-28 LAB — COMPREHENSIVE METABOLIC PANEL
ALT: 15 U/L (ref 0–35)
Alkaline Phosphatase: 56 U/L (ref 39–117)
CO2: 28 mEq/L (ref 19–32)
Creat: 0.8 mg/dL (ref 0.50–1.10)
Glucose, Bld: 81 mg/dL (ref 70–99)
Total Bilirubin: 0.3 mg/dL (ref 0.3–1.2)

## 2012-05-28 NOTE — Progress Notes (Signed)
[facility] NOTE  RFV: 1 month follow up appointment Subjective:    Patient ID: Diane Lowery, female    DOB: 1981/01/20, 32 y.o.   MRN: [id number]  HPI  31 yo F with HIV, CD 4 count of 140(24%)/VL 83,543 ( Jan 2014), started stribild on 2/6. Also has been on bactrim OI proph. Only missed 1 day of meds. But doing well overall.   has not been on cART since pregnancy in 2008, last #s in 2009 show CD 4 count 700s. Presents back in clinic to re-establish care.  Moved back to the area last June 2013, with 5 yr old daughter to have her closer to her daughter's birth-father. Still close to the family and her daughter's father. She is recently married in Sept - husband resides in [place] outside of [place]. When asked why she is getting back to care, her response,"my husband wanted me to". She and her new husband are trying to procreate in hopes of having a child.  Current Outpatient Prescriptions on File Prior to Visit  Medication Sig Dispense Refill  . elvitegravir-cobicistat-emtricitabine-tenofovir (STRIBILD) 150-150-200-300 MG TABS Take 1 tablet by mouth daily with breakfast.  30 tablet  11  . sulfamethoxazole-trimethoprim (BACTRIM DS) 800-160 MG per tablet Take 1 tablet by mouth daily.  30 tablet  5  . SUMAtriptan (IMITREX) 50 MG tablet Take 1 tablet (50 mg total) by mouth every 2 (two) hours as needed for migraine.  30 tablet  1   No current facility-administered medications on file prior to visit.    Review of Systems 10 point ROS negative other than self conscious about rash/scarring to chest    Objective:   Physical Exam BP 121/80  Pulse 76  Temp(Src) 97.9 F (36.6 C)  Ht 5\' 5"  (1.651 m)  Wt 147 lb (66.679 kg)  BMI 24.46 kg/m2  LMP 05/14/2012 Physical Exam  Constitutional: oriented to person, place, and time.  appears well-developed and well-nourished. No distress.  HENT:  Mouth/Throat: Oropharynx is clear and moist. No oropharyngeal exudate.  Cardiovascular: Normal  rate, regular rhythm and normal heart sounds. Exam reveals no gallop and no friction rub.  No murmur heard.  Pulmonary/Chest: Effort normal and breath sounds normal. No respiratory distress. He has no wheezes.  Lymphadenopathy:   no cervical adenopathy.  Skin: Skin is warm and dry. Scattered lesions c/w folliculitis          Assessment & Plan:   hiv = continue stribild, will check labs today  oi proph = continue bactrim DS daily  Health maintenance = non need for vaccines at this time  hiv prevention = husband doesn't want to use condoms. Recommended that he calls me to discuss the importance of using condoms at least until she is undetectable  Depression = feels ok with just talking to her husband. Does not want to start counseling or ssri at this time. rtc in 2 months

## 2012-05-29 LAB — T-HELPER CELL (CD4) - (RCID CLINIC ONLY)
CD4 % Helper T Cell: 26 % — ABNORMAL LOW (ref 33–55)
CD4 T Cell Abs: 200 uL — ABNORMAL LOW (ref 400–2700)

## 2012-05-30 LAB — HIV-1 RNA QUANT-NO REFLEX-BLD
HIV 1 RNA Quant: 59 copies/mL — ABNORMAL HIGH (ref <20)
HIV-1 RNA Quant, Log: 1.77 {Log} — ABNORMAL HIGH (ref <1.30)

## 2012-06-10 ENCOUNTER — Other Ambulatory Visit: Payer: Self-pay | Admitting: Internal Medicine

## 2012-06-10 ENCOUNTER — Encounter: Payer: Self-pay | Admitting: Internal Medicine

## 2012-06-10 ENCOUNTER — Telehealth: Payer: Self-pay | Admitting: *Deleted

## 2012-06-10 ENCOUNTER — Other Ambulatory Visit: Payer: BC Managed Care – PPO

## 2012-06-10 NOTE — Telephone Encounter (Signed)
Patient sent email stating she had a positive home pregnancy test and wanted to know if she can stay on Stribild. Wendall Mola

## 2012-06-11 ENCOUNTER — Encounter: Payer: Self-pay | Admitting: Internal Medicine

## 2012-06-11 ENCOUNTER — Other Ambulatory Visit (HOSPITAL_COMMUNITY)
Admission: RE | Admit: 2012-06-11 | Discharge: 2012-06-11 | Disposition: A | Discharge status: Home / Self Care | Payer: BC Managed Care – PPO | Source: Ambulatory Visit | Attending: Internal Medicine | Admitting: Internal Medicine

## 2012-06-11 ENCOUNTER — Ambulatory Visit (INDEPENDENT_AMBULATORY_CARE_PROVIDER_SITE_OTHER): Payer: BC Managed Care – PPO | Admitting: Internal Medicine

## 2012-06-11 ENCOUNTER — Ambulatory Visit: Payer: BC Managed Care – PPO | Admitting: Internal Medicine

## 2012-06-11 VITALS — BP 122/70 | HR 94 | Temp 98.0°F

## 2012-06-11 DIAGNOSIS — Z01419 Encounter for gynecological examination (general) (routine) without abnormal findings: Secondary | ICD-10-CM | POA: Insufficient documentation

## 2012-06-11 DIAGNOSIS — Z124 Encounter for screening for malignant neoplasm of cervix: Secondary | ICD-10-CM

## 2012-06-11 DIAGNOSIS — Z23 Encounter for immunization: Secondary | ICD-10-CM

## 2012-06-11 NOTE — Addendum Note (Signed)
Addended by: Carin Primrose on: 06/11/2012 02:20 PM   Modules accepted: Orders

## 2012-06-11 NOTE — Progress Notes (Signed)
[facility]   RFV: missed menses, positive urine pregnancy test Subjective:    Patient ID: Diane Lowery, female    DOB: [birth year], 32 y.o.   MRN: [id number]  HPI 32 yo F with HIV, CD4 count 200 (24%)/VL 59, stribild x 1 monthPatient seen last on 3/6 after 1 month stribild, did well missed one dose. Good viral suppression, at that visit, she reported unprotected sex at day 15-17 of her menstrual cycle. We did urine pregnancy test which was negative at last vist. She repeated urine test at home which was positive over the weekend. We had her come in to confirm urine preg test. Blood test is positive as well. She is started on pre-natal multivitamins. LMP 2/28, she recalls. Possibly now at [redacted] wks gestation Current Outpatient Prescriptions on File Prior to Visit  Medication Sig Dispense Refill  . elvitegravir-cobicistat-emtricitabine-tenofovir (STRIBILD) 150-150-200-300 MG TABS Take 1 tablet by mouth daily with breakfast.  30 tablet  11  . sulfamethoxazole-trimethoprim (BACTRIM DS) 800-160 MG per tablet Take 1 tablet by mouth daily.  30 tablet  5  . SUMAtriptan (IMITREX) 50 MG tablet Take 1 tablet (50 mg total) by mouth every 2 (two) hours as needed for migraine.  30 tablet  1   No current facility-administered medications on file prior to visit.     Review of Systems  Constitutional: Negative for fever, chills, diaphoresis, activity change, appetite change, fatigue and unexpected weight change.  HENT: Negative for congestion, sore throat, rhinorrhea, sneezing, trouble swallowing and sinus pressure.  Eyes: Negative for photophobia and visual disturbance.  Respiratory: Negative for cough, chest tightness, shortness of breath, wheezing and stridor.  Cardiovascular: Negative for chest pain, palpitations and leg swelling.  Gastrointestinal: Negative for nausea, vomiting, abdominal pain, diarrhea, constipation, blood in stool, abdominal distention and anal bleeding.  Genitourinary: Negative  for dysuria, hematuria, flank pain and difficulty urinating.  Musculoskeletal: Negative for myalgias, back pain, joint swelling, arthralgias and gait problem.  Skin: Negative for color change, pallor, rash and wound.  Neurological: Negative for dizziness, tremors, weakness and light-headedness.  Hematological: Negative for adenopathy. Does not bruise/bleed easily.  Psychiatric/Behavioral: Negative for behavioral problems, confusion, sleep disturbance, dysphoric mood, decreased concentration and agitation.       Objective:   Physical Exam LMP 05/14/2012 Physical Exam  Constitutional:  oriented to person, place, and time.  appears well-developed and well-nourished. No distress.  HENT:  Mouth/Throat: Oropharynx is clear and moist. No oropharyngeal exudate.  Cardiovascular: Normal rate, regular rhythm and normal heart sounds. Exam reveals no gallop and no friction rub.  No murmur heard.  Pulmonary/Chest: Effort normal and breath sounds normal. No respiratory distress. He has no wheezes.  Abdominal: Soft. Bowel sounds are normal. exhibits no distension. There is no tenderness.  Lymphadenopathy: no cervical adenopathy.  Skin: Skin is warm and dry. No rash noted. No erythema.  Psychiatric:  normal mood and affect. behavior is normal.          Assessment & Plan:  hiv and 1st trimester pregnancy = called [facility] hotline to weigh in on keeping on stribild vs.  Switching to truvada/ATV 300/rit 100, with anticipation to increase to ATV 400/rit 100 during 2nd-3rd trimester. Little data on women on stribild, likely safe but not well known in regards to cobicistat. Patient is willing to change over to truvada/ATVr for duration of pregnancy  oi proph = currently on bactrim. Her CD 4 count at 200 today, less likely to have PCP.  Migraine = recommend to  stop taking imitrex while pregnant; can only take tylenol  Pregnancy = will add folic acid supp in addn to prenatal. Schedule OB/gyn with [facility]  Ob/GYn  Depression = meeting with our counselor

## 2012-06-11 NOTE — Patient Instructions (Signed)

## 2012-06-11 NOTE — Progress Notes (Signed)
Subjective:    Patient ID: Diane Lowery, female    DOB: Nov 05, 1980, 32 y.o.   MRN: [id number]  HPI  Pt presents to the clinic today for her pap smear. She recently found out that her CD4 counts are below 200 and also that she is [redacted] weeks pregnant. She has not seen her OB/GYN yet. She is currently still on her retroviral therapy. She also needs her tdap today.   Review of Systems      Past Medical History  Diagnosis Date  . Migraines   . HIV infection   . History of chicken pox     Current Outpatient Prescriptions  Medication Sig Dispense Refill  . elvitegravir-cobicistat-emtricitabine-tenofovir (STRIBILD) 150-150-200-300 MG TABS Take 1 tablet by mouth daily with breakfast.  30 tablet  11  . Prenatal Vit-Fe Fumarate-FA (PRENATAL MULTIVITAMIN) TABS Take 2 tablets by mouth daily at 12 noon.      . sulfamethoxazole-trimethoprim (BACTRIM DS) 800-160 MG per tablet Take 1 tablet by mouth daily.  30 tablet  5  . SUMAtriptan (IMITREX) 50 MG tablet Take 1 tablet (50 mg total) by mouth every 2 (two) hours as needed for migraine.  30 tablet  1   No current facility-administered medications for this visit.    No Known Allergies  Family History  Problem Relation Age of Onset  . Stroke Maternal Aunt   . Hypertension Mother   . Hypertension Sister   . Cancer Neg Hx   . Diabetes Neg Hx   . Early death Neg Hx   . Hyperlipidemia Neg Hx     History   Social History  . Marital Status: Married    Spouse Name: N/A    Number of Children: 1  . Years of Education: 12+   Occupational History  . Customer Service    Social History Main Topics  . Smoking status: Never Smoker   . Smokeless tobacco: Never Used  . Alcohol Use: 1.8 oz/week    1 Glasses of wine, 1 Cans of beer, 1 Shots of liquor per week  . Drug Use: No  . Sexually Active: Yes -- Female partner(s)    Birth Control/ Protection: None     Comment: trying to become pregnant   Other Topics Concern  . Not on file    Social History Narrative   Regular exercise-no   Caffeine Use-yes     Constitutional: Denies fever, malaise, fatigue, headache or abrupt weight changes. Marland Kitchen Respiratory: Denies difficulty breathing, shortness of breath, cough or sputum production.   Cardiovascular: Denies chest pain, chest tightness, palpitations or swelling in the hands or feet.  Gastrointestinal: Denies abdominal pain, bloating, constipation, diarrhea or blood in the stool.  GU: Denies urgency, frequency, pain with urination, burning sensation, blood in urine, odor or discharge.   No other specific complaints in a complete review of systems (except as listed in HPI above).  Objective:   Physical Exam  HPI  Past Medical History  Diagnosis Date  . Migraines   . HIV infection   . History of chicken pox     Current Outpatient Prescriptions  Medication Sig Dispense Refill  . elvitegravir-cobicistat-emtricitabine-tenofovir (STRIBILD) 150-150-200-300 MG TABS Take 1 tablet by mouth daily with breakfast.  30 tablet  11  . Prenatal Vit-Fe Fumarate-FA (PRENATAL MULTIVITAMIN) TABS Take 2 tablets by mouth daily at 12 noon.      . sulfamethoxazole-trimethoprim (BACTRIM DS) 800-160 MG per tablet Take 1 tablet by mouth daily.  30 tablet  5  .  SUMAtriptan (IMITREX) 50 MG tablet Take 1 tablet (50 mg total) by mouth every 2 (two) hours as needed for migraine.  30 tablet  1   No current facility-administered medications for this visit.    No Known Allergies  Family History  Problem Relation Age of Onset  . Stroke Maternal Aunt   . Hypertension Mother   . Hypertension Sister   . Cancer Neg Hx   . Diabetes Neg Hx   . Early death Neg Hx   . Hyperlipidemia Neg Hx     History   Social History  . Marital Status: Married    Spouse Name: N/A    Number of Children: 1  . Years of Education: 12+   Occupational History  . Customer Service    Social History Main Topics  . Smoking status: Never Smoker   . Smokeless  tobacco: Never Used  . Alcohol Use: 1.8 oz/week    1 Glasses of wine, 1 Cans of beer, 1 Shots of liquor per week  . Drug Use: No  . Sexually Active: Yes -- Female partner(s)    Birth Control/ Protection: None     Comment: trying to become pregnant   Other Topics Concern  . Not on file   Social History Narrative   Regular exercise-no   Caffeine Use-yes    ROS:  Constitutional: Denies fever, malaise, fatigue, headache or abrupt weight changes.  HEENT: Denies eye pain, eye redness, ear pain, ringing in the ears, wax buildup, runny nose, nasal congestion, bloody nose, or sore throat. Respiratory: Denies difficulty breathing, shortness of breath, cough or sputum production.   Cardiovascular: Denies chest pain, chest tightness, palpitations or swelling in the hands or feet.  Gastrointestinal: Denies abdominal pain, bloating, constipation, diarrhea or blood in the stool.  GU: Denies frequency, urgency, pain with urination, blood in urine, odor or discharge. Musculoskeletal: Denies decrease in range of motion, difficulty with gait, muscle pain or joint pain and swelling.  Skin: Denies redness, rashes, lesions or ulcercations.  Neurological: Denies dizziness, difficulty with memory, difficulty with speech or problems with balance and coordination.   No other specific complaints in a complete review of systems (except as listed in HPI above).  PE:  Constitutional:  Alert, oriented x 4, well developed, well nourished in no apparent distress.  Cardiovascular: Normal rate and rhythm. S1,S2 noted.  No murmur, rubs or gallops noted. No JVD or BLE edema. No carotid bruits noted. Pulmonary/Chest: Normal effort and positive vesicular breath sounds. No respiratory distress. No wheezes, rales or ronchi noted.  Abdomenl: Soft and nontender. Normal bowel sounds, no bruits noted. No distention or masses noted. Liver, spleen and kidneys non palpable. Genitourinary: Normal female anatomy. Uterus midline,  anterior and soft. No CMT or discharge noted. Adenexa non palpable. Breast without lumps or masses.           Assessment & Plan:   Pap smear:  Obtained pap smear today Will call you with the results when I get them Tdap given today Follow up with your OB/GYN

## 2012-06-15 DIAGNOSIS — O0001 Abdominal pregnancy with intrauterine pregnancy: Secondary | ICD-10-CM | POA: Insufficient documentation

## 2012-06-15 MED ORDER — EMTRICITABINE-TENOFOVIR DF 200-300 MG PO TABS: 1.0000 | ORAL_TABLET | Freq: Every day | ORAL | Status: DC

## 2012-06-15 MED ORDER — RITONAVIR 100 MG PO TABS: 100.0000 mg | ORAL_TABLET | Freq: Every day | ORAL | Status: DC

## 2012-06-15 MED ORDER — ATAZANAVIR SULFATE 300 MG PO CAPS: 300.0000 mg | ORAL_CAPSULE | Freq: Every day | ORAL | Status: DC

## 2012-06-17 ENCOUNTER — Other Ambulatory Visit: Payer: Self-pay | Admitting: Internal Medicine

## 2012-06-17 DIAGNOSIS — IMO0002 Reserved for concepts with insufficient information to code with codable children: Secondary | ICD-10-CM

## 2012-07-02 ENCOUNTER — Telehealth: Payer: Self-pay | Admitting: Licensed Clinical Social Worker

## 2012-07-02 NOTE — Telephone Encounter (Signed)
Patient walked in today concerned about how to get medications since her husband's insurance has lapsed and she is currently pregnant. Patient has applied for medicaid but she stated it would take up to 45 days to approve it. I asked the patient if she had an OB appointment scheduled and she said "I think so on April 16" Tomasita Morrow, RN and I advised her to see [facility] for high risk patients, since she is uninsured now and waiting for Medicaid they would see her. Her appointment is May 1 at 9:30 am. Kandice Robinsons met with her today and advised her of what paperwork to bring, patient was talked to in length about the importance of bringing the information in as soon as possible so the application can be faxed to [facility] an processed as an emergency application, usually approved within 48 hours. I spoke with Modena Morrow in the ADAP department in [place] and she is aware of the needs of this patient and awaiting the application.

## 2012-07-07 ENCOUNTER — Telehealth: Payer: Self-pay

## 2012-07-07 NOTE — Telephone Encounter (Signed)
Called patient and left message on her vm to see if she wanted to come in before 07/14/12 appt. Advised could help her with what to bring if she had any further questions, and to call me back - will advise if she does.

## 2012-07-07 NOTE — Telephone Encounter (Signed)
Hi tamika, can you check with Lakrista how many days she has left of meds? We should be able to get her through emergency medicaid or ADAP.

## 2012-07-07 NOTE — Telephone Encounter (Signed)
I called her and she said her phone was off for a few days but she will call and get the information she needs to bring Diane Lowery so we can process her ADAP application ASAP. She is currently out of medication.

## 2012-07-14 ENCOUNTER — Other Ambulatory Visit: Payer: BC Managed Care – PPO

## 2012-07-14 ENCOUNTER — Ambulatory Visit: Payer: BC Managed Care – PPO

## 2012-07-22 NOTE — Telephone Encounter (Signed)
Diane Lowery, any word on getting in touch with Diane Lowery. If she is not already back on her HIV meds, we need to get her on ASAP during her pregnancy. If no answer on her VM, she needs a letter sent to her home

## 2012-07-22 NOTE — Telephone Encounter (Signed)
Ok I will do that asap! I called her last week and she told me that her husband was faxing her the information that she needed to qualify for ADAP. Britta Mccreedy called her as well and she said she was coming in to bring the information. I will give her a call in the AM. I may have a bridge counselor to go out if I don't get in touch with her.

## 2012-07-23 ENCOUNTER — Ambulatory Visit (INDEPENDENT_AMBULATORY_CARE_PROVIDER_SITE_OTHER): Payer: Medicaid Other | Admitting: Family Medicine

## 2012-07-23 ENCOUNTER — Encounter: Payer: Self-pay | Admitting: Family Medicine

## 2012-07-23 VITALS — BP 122/81 | Temp 97.2°F | Wt 156.1 lb

## 2012-07-23 DIAGNOSIS — O0991 Supervision of high risk pregnancy, unspecified, first trimester: Secondary | ICD-10-CM | POA: Insufficient documentation

## 2012-07-23 DIAGNOSIS — IMO0002 Reserved for concepts with insufficient information to code with codable children: Secondary | ICD-10-CM

## 2012-07-23 DIAGNOSIS — O98719 Human immunodeficiency virus [HIV] disease complicating pregnancy, unspecified trimester: Secondary | ICD-10-CM | POA: Insufficient documentation

## 2012-07-23 DIAGNOSIS — O98711 Human immunodeficiency virus [HIV] disease complicating pregnancy, first trimester: Secondary | ICD-10-CM

## 2012-07-23 DIAGNOSIS — D069 Carcinoma in situ of cervix, unspecified: Secondary | ICD-10-CM | POA: Insufficient documentation

## 2012-07-23 DIAGNOSIS — O219 Vomiting of pregnancy, unspecified: Secondary | ICD-10-CM

## 2012-07-23 DIAGNOSIS — O98519 Other viral diseases complicating pregnancy, unspecified trimester: Secondary | ICD-10-CM

## 2012-07-23 DIAGNOSIS — R6889 Other general symptoms and signs: Secondary | ICD-10-CM

## 2012-07-23 LAB — POCT URINALYSIS DIP (DEVICE)
Hgb urine dipstick: NEGATIVE
Ketones, ur: NEGATIVE mg/dL
Protein, ur: NEGATIVE mg/dL
Specific Gravity, Urine: 1.025 (ref 1.005–1.030)
pH: 7 (ref 5.0–8.0)

## 2012-07-23 MED ORDER — ONDANSETRON 4 MG PO TBDP: 4.0000 mg | ORAL_TABLET | Freq: Three times a day (TID) | ORAL | Status: DC | PRN

## 2012-07-23 MED ORDER — PROMETHAZINE HCL 25 MG PO TABS: 25.0000 mg | ORAL_TABLET | Freq: Four times a day (QID) | ORAL | Status: DC | PRN

## 2012-07-23 NOTE — Progress Notes (Signed)
Patient reports some spotting. Reports she was told she needed a colposcopy.

## 2012-07-23 NOTE — Patient Instructions (Addendum)
Pregnancy - First Trimester During sexual intercourse, millions of sperm go into the vagina. Only 1 sperm will penetrate and fertilize the female egg while it is in the Fallopian tube. One week later, the fertilized egg implants into the wall of the uterus. An embryo begins to develop into a baby. At 6 to 8 weeks, the eyes and face are formed and the heartbeat can be seen on ultrasound. At the end of 12 weeks (first trimester), all the baby's organs are formed. Now that you are pregnant, you will want to do everything you can to have a healthy baby. Two of the most important things are to get good prenatal care and follow your caregiver's instructions. Prenatal care is all the medical care you receive before the baby's birth. It is given to prevent, find, and treat problems during the pregnancy and childbirth. PRENATAL EXAMS  During prenatal visits, your weight, blood pressure and urine are checked. This is done to make sure you are healthy and progressing normally during the pregnancy.  A pregnant woman should gain 25 to 35 pounds during the pregnancy. However, if you are over weight or underweight, your caregiver will advise you regarding your weight.  Your caregiver will ask and answer questions for you.  Blood work, cervical cultures, other necessary tests and a Pap test are done during your prenatal exams. These tests are done to check on your health and the probable health of your baby. Tests are strongly recommended and done for HIV with your permission. This is the virus that causes AIDS. These tests are done because medications can be given to help prevent your baby from being born with this infection should you have been infected without knowing it. Blood work is also used to find out your blood type, previous infections and follow your blood levels (hemoglobin).  Low hemoglobin (anemia) is common during pregnancy. Iron and vitamins are given to help prevent this. Later in the pregnancy, blood  tests for diabetes will be done along with any other tests if any problems develop. You may need tests to make sure you and the baby are doing well.  You may need other tests to make sure you and the baby are doing well. CHANGES DURING THE FIRST TRIMESTER (THE FIRST 3 MONTHS OF PREGNANCY) Your body goes through many changes during pregnancy. They vary from person to person. Talk to your caregiver about changes you notice and are concerned about. Changes can include:  Your menstrual period stops.  The egg and sperm carry the genes that determine what you look like. Genes from you and your partner are forming a baby. The female genes determine whether the baby is a boy or a girl.  Your body increases in girth and you may feel bloated.  Feeling sick to your stomach (nauseous) and throwing up (vomiting). If the vomiting is uncontrollable, call your caregiver.  Your breasts will begin to enlarge and become tender.  Your nipples may stick out more and become darker.  The need to urinate more. Painful urination may mean you have a bladder infection.  Tiring easily.  Loss of appetite.  Cravings for certain kinds of food.  At first, you may gain or lose a couple of pounds.  You may have changes in your emotions from day to day (excited to be pregnant or concerned something may go wrong with the pregnancy and baby).  You may have more vivid and strange dreams. HOME CARE INSTRUCTIONS   It is very important   to avoid all smoking, alcohol and un-prescribed drugs during your pregnancy. These affect the formation and growth of the baby. Avoid chemicals while pregnant to ensure the delivery of a healthy infant.  Start your prenatal visits by the 12th week of pregnancy. They are usually scheduled monthly at first, then more often in the last 2 months before delivery. Keep your caregiver's appointments. Follow your caregiver's instructions regarding medication use, blood and lab tests, exercise, and  diet.  During pregnancy, you are providing food for you and your baby. Eat regular, well-balanced meals. Choose foods such as meat, fish, milk and other low fat dairy products, vegetables, fruits, and whole-grain breads and cereals. Your caregiver will tell you of the ideal weight gain.  You can help morning sickness by keeping soda crackers at the bedside. Eat a couple before arising in the morning. You may want to use the crackers without salt on them.  Eating 4 to 5 small meals rather than 3 large meals a day also may help the nausea and vomiting.  Drinking liquids between meals instead of during meals also seems to help nausea and vomiting.  A physical sexual relationship may be continued throughout pregnancy if there are no other problems. Problems may be early (premature) leaking of amniotic fluid from the membranes, vaginal bleeding, or belly (abdominal) pain.  Exercise regularly if there are no restrictions. Check with your caregiver or physical therapist if you are unsure of the safety of some of your exercises. Greater weight gain will occur in the last 2 trimesters of pregnancy. Exercising will help:  Control your weight.  Keep you in shape.  Prepare you for labor and delivery.  Help you lose your pregnancy weight after you deliver your baby.  Wear a good support or jogging bra for breast tenderness during pregnancy. This may help if worn during sleep too.  Ask when prenatal classes are available. Begin classes when they are offered.  Do not use hot tubs, steam rooms or saunas.  Wear your seat belt when driving. This protects you and your baby if you are in an accident.  Avoid raw meat, uncooked cheese, cat litter boxes and soil used by cats throughout the pregnancy. These carry germs that can cause birth defects in the baby.  The first trimester is a good time to visit your dentist for your dental health. Getting your teeth cleaned is OK. Use a softer toothbrush and brush  gently during pregnancy.  Ask for help if you have financial, counseling or nutritional needs during pregnancy. Your caregiver will be able to offer counseling for these needs as well as refer you for other special needs.  Do not take any medications or herbs unless told by your caregiver.  Inform your caregiver if there is any mental or physical domestic violence.  Make a list of emergency phone numbers of family, friends, hospital, and police and fire departments.  Write down your questions. Take them to your prenatal visit.  Do not douche.  Do not cross your legs.  If you have to stand for long periods of time, rotate you feet or take small steps in a circle.  You may have more vaginal secretions that may require a sanitary pad. Do not use tampons or scented sanitary pads. MEDICATIONS AND DRUG USE IN PREGNANCY  Take prenatal vitamins as directed. The vitamin should contain 1 milligram of folic acid. Keep all vitamins out of reach of children. Only a couple vitamins or tablets containing iron may be   fatal to a baby or young child when ingested.  Avoid use of all medications, including herbs, over-the-counter medications, not prescribed or suggested by your caregiver. Only take over-the-counter or prescription medicines for pain, discomfort, or fever as directed by your caregiver. Do not use aspirin, ibuprofen, or naproxen unless directed by your caregiver.  Let your caregiver also know about herbs you may be using.  Alcohol is related to a number of birth defects. This includes fetal alcohol syndrome. All alcohol, in any form, should be avoided completely. Smoking will cause low birth rate and premature babies.  Street or illegal drugs are very harmful to the baby. They are absolutely forbidden. A baby born to an addicted mother will be addicted at birth. The baby will go through the same withdrawal an adult does.  Let your caregiver know about any medications that you have to take  and for what reason you take them. MISCARRIAGE IS COMMON DURING PREGNANCY A miscarriage does not mean you did something wrong. It is not a reason to worry about getting pregnant again. Your caregiver will help you with questions you may have. If you have a miscarriage, you may need minor surgery. SEEK MEDICAL CARE IF:  You have any concerns or worries during your pregnancy. It is better to call with your questions if you feel they cannot wait, rather than worry about them. SEEK IMMEDIATE MEDICAL CARE IF:   An unexplained oral temperature above 102 F (38.9 C) develops, or as your caregiver suggests.  You have leaking of fluid from the vagina (birth canal). If leaking membranes are suspected, take your temperature and inform your caregiver of this when you call.  There is vaginal spotting or bleeding. Notify your caregiver of the amount and how many pads are used.  You develop a bad smelling vaginal discharge with a change in the color.  You continue to feel sick to your stomach (nauseated) and have no relief from remedies suggested. You vomit blood or coffee ground-like materials.  You lose more than 2 pounds of weight in 1 week.  You gain more than 2 pounds of weight in 1 week and you notice swelling of your face, hands, feet, or legs.  You gain 5 pounds or more in 1 week (even if you do not have swelling of your hands, face, legs, or feet).  You get exposed to German measles and have never had them.  You are exposed to fifth disease or chickenpox.  You develop belly (abdominal) pain. Round ligament discomfort is a common non-cancerous (benign) cause of abdominal pain in pregnancy. Your caregiver still must evaluate this.  You develop headache, fever, diarrhea, pain with urination, or shortness of breath.  You fall or are in a car accident or have any kind of trauma.  There is mental or physical violence in your home. Document Released: 03/05/2001 Document Revised: 06/03/2011  Document Reviewed: 09/06/2008 [employer] Patient Information 2013 [employer].  

## 2012-07-23 NOTE — Progress Notes (Signed)
    Subjective:    Diane Lowery is being seen today for her first obstetrical visit.  This is not a planned pregnancy. She is at [redacted]w[redacted]d gestation by LMP (unsure). No ultrasound yet. Her obstetrical history is significant for HIV. Relationship with FOB: spouse, living together. Patient does not intend to breast feed. Pregnancy history fully reviewed.  HIV: stopped medications 1st week in march because of pregnancy and lack of insurance. Didn't take any meds from 2008 until January this year. Working on [employer] forces operational [employer] for HIV treatment.  Last day last period 2/20 not really sure.  No cough, SOB, no cramping, some light spotting.  PAP - hx abnl in March, was supposed to get colpo but no insurance and did not follow up. 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 - OB care    Menstrual History:  OB History   Grav Para Term Preterm Abortions TAB SAB Ect Mult Living   2 1 1       1       Patient's last menstrual period was 05/14/2012.    The following portions of the patient's history were reviewed and updated as appropriate: allergies, current medications, past family history, past medical history, past social history, past surgical history and problem list.  Review of Systems   Pertinent pos and neg mentioned in HPI   Objective:   GEN:  WNWD, no distress HEENT: NCAT, EOMI, conjunctiva clear NECK: supple, no lymphadenopathy or thyromegaly CV:  RRR, no murmur RESP:  CTAB ABD:  Soft, non-tender, normal bowel sounds EXTREM:  No edema, non-tender NEURO:  Alert, oriented, no focal deficits.  Assessment:    Pregnancy at 10 weeks. Patient Active Problem List   Diagnosis Date Noted  . 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 Supervision of high risk pregnancy in first trimester 07/23/2012  . Maternal HIV positive complicating pregnancy, antepartum 07/23/2012  . Abdominal pregnancy with intrauterine  pregnancy 06/15/2012  . HIV DISEASE 10/28/2006  . HSV 10/28/2006  . 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 MIGRAINE, UNSPEC., W/O INTRACTABLE MIGRAINE 05/22/2006      Plan:    Initial labs drawn. Prenatal vitamins. Problem list reviewed and updated. AFP3 discussed: declined first trimester screen - may opt for quad screen. Role of ultrasound in pregnancy discussed; fetal survey: requested. Amniocentesis discussed: not indicated. HIV:  Sees Infectious Disease. Viral load 59 one month ago, and CD4 count 200. LSIL:  Needs colpo and need records from previous pap. Follow up in 4 weeks. 50% of 45 min visit spent on counseling and coordination of care.

## 2012-07-23 NOTE — Progress Notes (Signed)
Nutrition note: 1st visit consult Pt has gained 13.1# @ 10w, which is > expected. Pt reports eating 3 meals & 3-4 snacks/d. Pt is taking PNV. Pt reports some nausea but no vomiting or heartburn. Pt received verbal & written education on general nutrition during pregnancy. Disc tips to decrease nausea. Encouraged decreasing portion sizes. Disc wt gain goals of 25-35# or 1#/wk in 2nd & 3rd trimester. Pt agrees to continue taking PNV & monitor portion sizes. Pt does not have WIC but plans to apply. Pt cannot BF due to HIV. F/u if referred Blondell Reveal, MS, RD, LDN

## 2012-07-24 LAB — OBSTETRIC PANEL
Antibody Screen: NEGATIVE
Basophils Absolute: 0 10*3/uL (ref 0.0–0.1)
Basophils Relative: 1 % (ref 0–1)
Eosinophils Absolute: 0.2 10*3/uL (ref 0.0–0.7)
Eosinophils Relative: 7 % — ABNORMAL HIGH (ref 0–5)
HCT: 33.8 % — ABNORMAL LOW (ref 36.0–46.0)
Lymphocytes Relative: 28 % (ref 12–46)
MCH: 30.2 pg (ref 26.0–34.0)
MCHC: 33.7 g/dL (ref 30.0–36.0)
MCV: 89.4 fL (ref 78.0–100.0)
Monocytes Absolute: 0.4 10*3/uL (ref 0.1–1.0)
RDW: 14 % (ref 11.5–15.5)
Rubella: 2.49 Index — ABNORMAL HIGH (ref <0.90)

## 2012-07-24 LAB — GLUCOSE TOLERANCE, 1 HOUR (50G) W/O FASTING: Glucose, 1 Hour GTT: 90 mg/dL (ref 70–140)

## 2012-07-25 ENCOUNTER — Encounter: Payer: Self-pay | Admitting: Family Medicine

## 2012-07-26 ENCOUNTER — Encounter: Payer: Self-pay | Admitting: Family Medicine

## 2012-07-27 LAB — HEMOGLOBINOPATHY EVALUATION
Hgb A2 Quant: 2.6 % (ref 2.2–3.2)
Hgb A: 97.1 % (ref 96.8–97.8)
Hgb S Quant: 0 %

## 2012-07-28 ENCOUNTER — Ambulatory Visit (INDEPENDENT_AMBULATORY_CARE_PROVIDER_SITE_OTHER): Payer: Medicaid Other | Admitting: Internal Medicine

## 2012-07-28 ENCOUNTER — Encounter: Payer: Self-pay | Admitting: Internal Medicine

## 2012-07-28 VITALS — BP 124/78 | HR 85 | Temp 98.1°F | Ht 65.0 in | Wt 155.0 lb

## 2012-07-28 DIAGNOSIS — B20 Human immunodeficiency virus [HIV] disease: Secondary | ICD-10-CM

## 2012-07-28 MED ORDER — ATAZANAVIR SULFATE 300 MG PO CAPS: 300.0000 mg | ORAL_CAPSULE | Freq: Every day | ORAL | Status: DC

## 2012-07-28 MED ORDER — RITONAVIR 100 MG PO TABS: 100.0000 mg | ORAL_TABLET | Freq: Every day | ORAL | Status: DC

## 2012-07-28 MED ORDER — EMTRICITABINE-TENOFOVIR DF 200-300 MG PO TABS: 1.0000 | ORAL_TABLET | Freq: Every day | ORAL | Status: DC

## 2012-07-28 NOTE — Progress Notes (Signed)
Subjective:    Patient ID: Diane Lowery, female    DOB: 11-10-1980, 32 y.o.   MRN: [id number]  HPI she reports that she has not been on ART since our last visit after finishing stribild due to loss of insurance. She states that she is on the waitlist for medicaid. She is emotionally distraught about having her pregnancy and that it is difficult for her with her husband living out of state. There is significant financial pressures, food insecurity, borrowed money from her first child's husband for electricity and rent.  She would like to restart her hiv treatment  ROS:  + endorses that she has been sleeping predominantly throughout the day, only awake to care for her 57yr old daughter. Not taking hiv nor prenatals currently. Doesn't eat regularly. She reports being More irritable than her baseline. Other 10 point ROS are negative  Current Outpatient Prescriptions on File Prior to Visit  Medication Sig Dispense Refill  . Prenatal Vit-Fe Fumarate-FA (PRENATAL MULTIVITAMIN) TABS Take 2 tablets by mouth daily at 12 noon.      . ondansetron (ZOFRAN ODT) 4 MG disintegrating tablet Take 1 tablet (4 mg total) by mouth every 8 (eight) hours as needed for nausea.  30 tablet  0  . promethazine (PHENERGAN) 25 MG tablet Take 1 tablet (25 mg total) by mouth every 6 (six) hours as needed for nausea.  30 tablet  2  . SUMAtriptan (IMITREX) 50 MG tablet Take 1 tablet (50 mg total) by mouth every 2 (two) hours as needed for migraine.  30 tablet  1   No current facility-administered medications on file prior to visit.   Active Ambulatory Problems    Diagnosis Date Noted  . HIV DISEASE 10/28/2006  . HSV 10/28/2006  . 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 MIGRAINE, UNSPEC., W/O INTRACTABLE MIGRAINE 05/22/2006  . Abdominal pregnancy with intrauterine pregnancy 06/15/2012  . 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 Supervision of high risk pregnancy in first trimester 07/23/2012  . Maternal  HIV positive complicating pregnancy, antepartum 07/23/2012   Resolved Ambulatory Problems    Diagnosis Date Noted  . No Resolved Ambulatory Problems   Past Medical History  Diagnosis Date  . HIV infection   . History of chicken pox   . Migraines   . Gestational diabetes    Social and family hx unchanged since last visit  Review of Systems     Objective:   Physical Exam BP 124/78  Pulse 85  Temp(Src) 98.1 F (36.7 C) (Oral)  Ht 5\' 5"  (1.651 m)  Wt 155 lb (70.308 kg)  BMI 25.79 kg/m2  LMP 05/14/2012 Physical Exam  Constitutional: oriented to person, place, and time.  appears well-developed and well-nourished. No distress.  HENT:  Mouth/Throat: Oropharynx is clear and moist. No oropharyngeal exudate.  Cardiovascular: Normal rate, regular rhythm and normal heart sounds. Exam reveals no gallop and no friction rub.  No murmur heard.  Pulmonary/Chest: Effort normal and breath sounds normal. No respiratory distress. no wheezes.  Abdominal: Soft. Bowel sounds are normal. He exhibits no distension. There is no tenderness.  Lymphadenopathy: no cervical adenopathy.  Skin: Skin is warm and dry. No rash noted. No erythema.  Psychiatric: tearful throughout the visit         Assessment & Plan:   - represcribe truvada/ATV 300/rit 100 via emergency aDAP.discussed the importance of HIV ART management - she is trying to figure out if she would like to keep this pregnancy or not.  Currently at 10wk 5 dy gestation  - referred to [facility], Met with alison to get counseling nad assistance through [facility]  Depression = not open to meet with Bernette Redbird, possibly better with female counselor  Spent greater than 45 minutes counseling patient and her needs

## 2012-08-03 ENCOUNTER — Ambulatory Visit (INDEPENDENT_AMBULATORY_CARE_PROVIDER_SITE_OTHER): Payer: Medicaid Other | Admitting: Internal Medicine

## 2012-08-03 ENCOUNTER — Encounter: Payer: Self-pay | Admitting: Internal Medicine

## 2012-08-03 VITALS — BP 126/77 | HR 79 | Ht 65.0 in | Wt 158.5 lb

## 2012-08-03 DIAGNOSIS — O98519 Other viral diseases complicating pregnancy, unspecified trimester: Secondary | ICD-10-CM

## 2012-08-03 DIAGNOSIS — B20 Human immunodeficiency virus [HIV] disease: Secondary | ICD-10-CM

## 2012-08-03 DIAGNOSIS — O98711 Human immunodeficiency virus [HIV] disease complicating pregnancy, first trimester: Secondary | ICD-10-CM

## 2012-08-03 NOTE — Progress Notes (Signed)
[facility] CLINIC NOTE  RFV: routine, follow-up Subjective:    Patient ID: Diane Lowery, female    DOB: [birth year], 32 y.o.   MRN: [id number]  HPI  32 yo F with known HIV, off of therapy, but now pregnant, Gestational Wk 11, CD 4 count of 200/ VL 59. Now on day 5 of starting new regimen on truvada/atz/ritonavir and taking prenatals. No reflux or nausea.  Wants to keep her pregnancy. Aiming to leave end of June to be back with her husband. She is thinking about moving back to [place].   Having financial difficulties, sold couch and coffee table to pay her phone bill and electricity.  Next ob appt is 22nd, her u/s  Current Outpatient Prescriptions on File Prior to Visit  Medication Sig Dispense Refill  . atazanavir (REYATAZ) 300 MG capsule Take 1 capsule (300 mg total) by mouth daily with breakfast.  30 capsule  11  . emtricitabine-tenofovir (TRUVADA) 200-300 MG per tablet Take 1 tablet by mouth daily.  30 tablet  11  . ondansetron (ZOFRAN ODT) 4 MG disintegrating tablet Take 1 tablet (4 mg total) by mouth every 8 (eight) hours as needed for nausea.  30 tablet  0  . Prenatal Vit-Fe Fumarate-FA (PRENATAL MULTIVITAMIN) TABS Take 2 tablets by mouth daily at 12 noon.      . promethazine (PHENERGAN) 25 MG tablet Take 1 tablet (25 mg total) by mouth every 6 (six) hours as needed for nausea.  30 tablet  2  . ritonavir (NORVIR) 100 MG TABS Take 1 tablet (100 mg total) by mouth daily.  30 tablet  11  . SUMAtriptan (IMITREX) 50 MG tablet Take 1 tablet (50 mg total) by mouth every 2 (two) hours as needed for migraine.  30 tablet  1   No current facility-administered medications on file prior to visit.   Active Ambulatory Problems    Diagnosis Date Noted  . HIV DISEASE 10/28/2006  . HSV 10/28/2006  . 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 MIGRAINE, UNSPEC., W/O INTRACTABLE MIGRAINE 05/22/2006  . Abdominal pregnancy with intrauterine pregnancy 06/15/2012  . LSIL (low grade squamous intraepithelial lesion) on Pap  smear 07/23/2012  . Supervision of high risk pregnancy in first trimester 07/23/2012  . Maternal HIV positive complicating pregnancy, antepartum 07/23/2012   Resolved Ambulatory Problems    Diagnosis Date Noted  . No Resolved Ambulatory Problems   Past Medical History  Diagnosis Date  . HIV infection   . History of chicken pox   . Migraines   . Gestational diabetes      Review of Systems Mood fluctuates     Objective:   Physical Exam BP 126/77  Pulse 79  Ht 5\' 5"  (1.651 m)  Wt 158 lb 8 oz (71.895 kg)  BMI 26.38 kg/m2  LMP 05/14/2012 Physical Exam  Constitutional: is oriented to person, place, and time.  appears well-developed and well-nourished. No distress.  HENT:  Mouth/Throat: Oropharynx is clear and moist. No oropharyngeal exudate.  Cardiovascular: Normal rate, regular rhythm and normal heart sounds. Exam reveals no gallop and no friction rub.  No murmur heard.  Pulmonary/Chest: Effort normal and breath sounds normal. No respiratory distress.  no wheezes.  Abdominal: Soft. Bowel sounds are normal.  exhibits no distension. There is no tenderness.  Lymphadenopathy:  no cervical adenopathy.  Neurological: alert and oriented to person, place, and time.  Skin: Skin is warm and dry. No rash noted. No erythema.  Psychiatric: appropriate        Assessment &  Plan:  hiv = continue with meds, will need weekly visit in order to ensure adherence. Check labs in 3wk.  Pregnancy = continue prenatals  Depression = has lot of stressors to cause her symptoms.  Financial stressors = having help [facility].

## 2012-08-04 ENCOUNTER — Ambulatory Visit: Payer: Self-pay | Admitting: Internal Medicine

## 2012-08-11 ENCOUNTER — Encounter: Payer: Self-pay | Admitting: Internal Medicine

## 2012-08-11 ENCOUNTER — Ambulatory Visit (INDEPENDENT_AMBULATORY_CARE_PROVIDER_SITE_OTHER): Payer: Medicaid Other | Admitting: Internal Medicine

## 2012-08-11 VITALS — BP 124/78 | HR 79 | Temp 97.9°F | Wt 158.0 lb

## 2012-08-11 DIAGNOSIS — K219 Gastro-esophageal reflux disease without esophagitis: Secondary | ICD-10-CM

## 2012-08-11 DIAGNOSIS — B20 Human immunodeficiency virus [HIV] disease: Secondary | ICD-10-CM

## 2012-08-11 DIAGNOSIS — O98712 Human immunodeficiency virus [HIV] disease complicating pregnancy, second trimester: Secondary | ICD-10-CM

## 2012-08-11 DIAGNOSIS — O98519 Other viral diseases complicating pregnancy, unspecified trimester: Secondary | ICD-10-CM

## 2012-08-11 NOTE — Progress Notes (Signed)
[facility] CLINIC NOTE  RFV: routine weekly visit in the setting of pregnancy  Subjective:    Patient ID: Diane Lowery, female    DOB: 03-29-1980, 32 y.o.   MRN: [id number]  HPI 32 yo F with known HIV,  Gestational Wk 13, CD 4 count of 200/ VL 59.  on truvada/atz/ritonavir which started on 07/29/12 and taking prenatals. alittle bit of reflux and burping of late. No missing a dose. Eating well. Not as worried about finances.  Current Outpatient Prescriptions on File Prior to Visit  Medication Sig Dispense Refill  . atazanavir (REYATAZ) 300 MG capsule Take 1 capsule (300 mg total) by mouth daily with breakfast.  30 capsule  11  . emtricitabine-tenofovir (TRUVADA) 200-300 MG per tablet Take 1 tablet by mouth daily.  30 tablet  11  . Prenatal Vit-Fe Fumarate-FA (PRENATAL MULTIVITAMIN) TABS Take 2 tablets by mouth daily at 12 noon.      . ritonavir (NORVIR) 100 MG TABS Take 1 tablet (100 mg total) by mouth daily.  30 tablet  11  . SUMAtriptan (IMITREX) 50 MG tablet Take 1 tablet (50 mg total) by mouth every 2 (two) hours as needed for migraine.  30 tablet  1  . ondansetron (ZOFRAN ODT) 4 MG disintegrating tablet Take 1 tablet (4 mg total) by mouth every 8 (eight) hours as needed for nausea.  30 tablet  0  . promethazine (PHENERGAN) 25 MG tablet Take 1 tablet (25 mg total) by mouth every 6 (six) hours as needed for nausea.  30 tablet  2   No current facility-administered medications on file prior to visit.   Active Ambulatory Problems    Diagnosis Date Noted  . HIV DISEASE 10/28/2006  . HSV 10/28/2006  . 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 MIGRAINE, UNSPEC., W/O INTRACTABLE MIGRAINE 05/22/2006  . Abdominal pregnancy with intrauterine pregnancy 06/15/2012  . 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 Supervision of high risk pregnancy in first trimester 07/23/2012  . Maternal HIV positive complicating pregnancy, antepartum 07/23/2012   Resolved Ambulatory Problems   Diagnosis Date Noted  . No Resolved Ambulatory Problems   Past Medical History  Diagnosis Date  . HIV infection   . History of chicken pox   . Migraines   . Gestational diabetes    Social and family hx unchanged    Review of Systems  10 point ROS Reviewed, no positive or negative pertinents    Objective:   Physical Exam BP 124/78  Pulse 79  Temp(Src) 97.9 F (36.6 C) (Oral)  Wt 158 lb (71.668 kg)  BMI 26.29 kg/m2  LMP 05/14/2012 Physical Exam  Constitutional:  oriented to person, place, and time.  appears well-developed and well-nourished. No distress.  HENT:  Mouth/Throat: Oropharynx is clear and moist. No oropharyngeal exudate.  Cardiovascular: Normal rate, regular rhythm and normal heart sounds. Exam reveals no gallop and no friction rub.  No murmur heard.  Pulmonary/Chest: Effort normal and breath sounds normal. No respiratory distress. He has no wheezes.  Abdominal: Soft. Bowel sounds are normal. He exhibits no distension. There is no tenderness.  Lymphadenopathy:  no cervical adenopathy.  Neurological:  alert and oriented to person, place, and time.  Skin: Skin is warm and dry. No rash noted. No erythema.  Psychiatric:  normal mood and affect.  behavior is normal.       Assessment & Plan:  HIV = continue with atz/r/truvada. Will check in 2 wks at next visit.  25 min  spent with patient with greater than 50% spent in counseling patient with adherence to medication and expect side effects.  Pregnancy = continue on multivitamin/prenatal. Has u/s at the end of month  GERD/burping = tolerable for now and doesn't need medication  Headache = will take tylenol prn

## 2012-08-13 ENCOUNTER — Encounter: Payer: Self-pay | Admitting: *Deleted

## 2012-08-17 NOTE — Progress Notes (Signed)
HPI: Diane Lowery is a 32 y.o. female who is pregnant. She is here for her f/u visit.   Allergies: No Known Allergies  Vitals:    Past Medical History: Past Medical History  Diagnosis Date  . HIV infection   . History of chicken pox   . Migraines     Excedrin migraine or immitrex  . Gestational diabetes     diet controlled    Social History: History   Social History  . Marital Status: Married    Spouse Name: N/A    Number of Children: 1  . Years of Education: 12+   Occupational History  . Customer Service    Social History Main Topics  . Smoking status: Never Smoker   . Smokeless tobacco: Never Used  . Alcohol Use: No  . Drug Use: No  . Sexually Active: Yes -- Female partner(s)    Birth Control/ Protection: None     Comment: using condoms   Other Topics Concern  . None   Social History Narrative   Regular exercise-no   Caffeine Use-yes    Previous Regimen:   Current Regimen: ATV/r + Truvada  Labs: HIV 1 RNA Quant (copies/mL)  Date Value  05/28/2012 59*  04/16/2012 83543*  08/20/2007 5120*     CD4 T Cell Abs (cmm)  Date Value  05/28/2012 200*  04/16/2012 140*  08/20/2007 740      Hep B S Ab (no units)  Date Value  04/16/2012 REACTIVE*     Hepatitis B Surface Ag (no units)  Date Value  07/23/2012 NEGATIVE      HCV Ab (no units)  Date Value  04/16/2012 NEGATIVE     CrCl: The CrCl is unknown because both a height and weight (above a minimum accepted value) are required for this calculation.  Lipids:    Component Value Date/Time   CHOL 155 04/01/2012 1116   TRIG 112.0 04/01/2012 1116   HDL 44.20 04/01/2012 1116   CHOLHDL 4 04/01/2012 1116   VLDL 22.4 04/01/2012 1116   LDLCALC 88 04/01/2012 1116    Assessment: She is taking her meds appropriately with minimal side effects. She denied missing any doses.   Recommendations: Cont ATV/r + Truvada  Clide Cliff, PharmD Clinical Infectious Disease Pharmacist [facility] 08/17/2012, 11:09 PM

## 2012-08-18 ENCOUNTER — Ambulatory Visit (HOSPITAL_COMMUNITY)
Admission: RE | Admit: 2012-08-18 | Discharge: 2012-08-18 | Disposition: A | Discharge status: Home / Self Care | Payer: Medicaid Other | Source: Ambulatory Visit | Attending: Family Medicine | Admitting: Family Medicine

## 2012-08-18 ENCOUNTER — Encounter (HOSPITAL_COMMUNITY): Payer: Self-pay

## 2012-08-18 ENCOUNTER — Other Ambulatory Visit: Payer: Self-pay | Admitting: Family Medicine

## 2012-08-18 DIAGNOSIS — Z3689 Encounter for other specified antenatal screening: Secondary | ICD-10-CM | POA: Insufficient documentation

## 2012-08-18 DIAGNOSIS — O0991 Supervision of high risk pregnancy, unspecified, first trimester: Secondary | ICD-10-CM

## 2012-08-18 DIAGNOSIS — O09299 Supervision of pregnancy with other poor reproductive or obstetric history, unspecified trimester: Secondary | ICD-10-CM | POA: Insufficient documentation

## 2012-08-20 ENCOUNTER — Encounter: Payer: Self-pay | Admitting: Obstetrics & Gynecology

## 2012-08-24 ENCOUNTER — Telehealth: Payer: Self-pay | Admitting: Obstetrics and Gynecology

## 2012-08-24 NOTE — Telephone Encounter (Addendum)
Message copied by Toula Moos on Mon Aug 24, 2012  8:06 AM   ------Called patient and notified of u/s result noted below. Patient agrees and satisfied.        Message from: FERRY, [place]      Created: Fri Aug 21, 2012  1:59 PM       Anatomy not completely visualized due to early date of ultrasound. Needs to get f/u anatomy survey at 18-20 weeks (in about 3-4 weeks) ------

## 2012-08-25 NOTE — Progress Notes (Signed)
Patient ID: Diane Lowery, female   DOB: 1980/03/28, 32 y.o.   MRN: [id number] [facility] CM Artis Delay

## 2012-08-27 ENCOUNTER — Encounter: Payer: Self-pay | Admitting: Internal Medicine

## 2012-08-27 ENCOUNTER — Ambulatory Visit (INDEPENDENT_AMBULATORY_CARE_PROVIDER_SITE_OTHER): Payer: Medicaid Other | Admitting: Internal Medicine

## 2012-08-27 VITALS — BP 134/78 | HR 92 | Temp 97.4°F | Wt 157.0 lb

## 2012-08-27 DIAGNOSIS — B20 Human immunodeficiency virus [HIV] disease: Secondary | ICD-10-CM

## 2012-08-27 LAB — CBC
Platelets: 323 10*3/uL (ref 150–400)
RBC: 3.85 MIL/uL — ABNORMAL LOW (ref 3.87–5.11)
RDW: 12.7 % (ref 11.5–15.5)
WBC: 5.2 10*3/uL (ref 4.0–10.5)

## 2012-08-27 LAB — OB RESULTS CONSOLE HIV ANTIBODY (ROUTINE TESTING): HIV: REACTIVE

## 2012-08-27 LAB — COMPREHENSIVE METABOLIC PANEL
ALT: 10 U/L (ref 0–35)
CO2: 23 mEq/L (ref 19–32)
Calcium: 9.1 mg/dL (ref 8.4–10.5)
Chloride: 101 mEq/L (ref 96–112)
Glucose, Bld: 84 mg/dL (ref 70–99)
Sodium: 133 mEq/L — ABNORMAL LOW (ref 135–145)
Total Protein: 6.9 g/dL (ref 6.0–8.3)

## 2012-08-27 NOTE — Progress Notes (Signed)
[facility] CLINIC NOTE  RFV: routine to prevent perinatal transmission Subjective:    Patient ID: Diane Lowery, female    DOB: [birth year], 32 y.o.   MRN: [id number]  HPI 32yo F with HIV, CD 4 count 200 VL 59, on ATZr/truvada at [redacted] wks gestation. Taking meds regularly. Have not missed a dose. Has intermittent nausea, once a week. Vomited her medicines x one day, otherwise she has been doing well.  She has a very complex social scenario. She is currently not working, has a 21 yo daughter. Her newly wedded husband lives in [place]. She was recently nearly evicted from her apartment and now lives with her daughter's birth father (her former partner) which is not ideal for her current relationship. Her husband would like to move them back to [place] but he has recently just started a job. Thus she is Planning to move to [place] to move in with her husband in  2wks.  Current Outpatient Prescriptions on File Prior to Visit  Medication Sig Dispense Refill  . atazanavir (REYATAZ) 300 MG capsule Take 1 capsule (300 mg total) by mouth daily with breakfast.  30 capsule  11  . emtricitabine-tenofovir (TRUVADA) 200-300 MG per tablet Take 1 tablet by mouth daily.  30 tablet  11  . ondansetron (ZOFRAN ODT) 4 MG disintegrating tablet Take 1 tablet (4 mg total) by mouth every 8 (eight) hours as needed for nausea.  30 tablet  0  . Prenatal Vit-Fe Fumarate-FA (PRENATAL MULTIVITAMIN) TABS Take 2 tablets by mouth daily at 12 noon.      . promethazine (PHENERGAN) 25 MG tablet Take 1 tablet (25 mg total) by mouth every 6 (six) hours as needed for nausea.  30 tablet  2  . ritonavir (NORVIR) 100 MG TABS Take 1 tablet (100 mg total) by mouth daily.  30 tablet  11  . SUMAtriptan (IMITREX) 50 MG tablet Take 1 tablet (50 mg total) by mouth every 2 (two) hours as needed for migraine.  30 tablet  1   No current facility-administered medications on file prior to visit.   Active Ambulatory Problems    Diagnosis Date  Noted  . HIV DISEASE 10/28/2006  . HSV 10/28/2006  . 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 MIGRAINE, UNSPEC., W/O INTRACTABLE MIGRAINE 05/22/2006  . Abdominal pregnancy with intrauterine pregnancy 06/15/2012  . 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 Supervision of high risk pregnancy in first trimester 07/23/2012  . Maternal HIV positive complicating pregnancy, antepartum 07/23/2012   Resolved Ambulatory Problems    Diagnosis Date Noted  . No Resolved Ambulatory Problems   Past Medical History  Diagnosis Date  . HIV infection   . History of chicken pox   . Migraines   . Gestational diabetes     Soc hx: no smoking or drinking. Just moved in with her former partner due to not having money to live on her own.  Review of Systems 14 point of ROS negative    Objective:   Physical Exam BP 134/78  Pulse 92  Temp(Src) 97.4 F (36.3 C) (Oral)  Wt 157 lb (71.215 kg)  BMI 26.13 kg/m2  LMP 05/14/2012 Physical Exam  Constitutional:  is oriented to person, place, and time.  appears well-developed and well-nourished. No distress.  HENT:  Mouth/Throat: Oropharynx is clear and moist. No oropharyngeal exudate.  Cardiovascular: Normal rate, regular rhythm and normal heart sounds. Exam reveals no gallop and no friction rub.  No murmur heard.  Pulmonary/Chest: Effort normal and breath sounds normal. No respiratory distress. He has no wheezes.  Lymphadenopathy:  no cervical adenopathy.  Neurological:  alert and oriented to person, place, and time.  Skin: Skin is warm and dry. No rash noted. No erythema.  Psychiatric: a normal mood and affect. behavior is normal.       Assessment & Plan:  hiv = will do cd 4 count and viral load to see that viral suppression.  Pregnancy = continue with prenatal vitamins. Appears to be doing well thus far. next u/s at 18-[redacted]wks gestation to ensure all things are ok. She thinks she is having a girl.  Moving to different state = currently  on emergency medicaid and adap. I am worried that she will have break in therapy if she does not do appropriate paperwork to get her started in [place]. Will make efforts to get her to have an addn months supply of meds prior to going to [place].  rtc in 1 wk for planning and check on overall health

## 2012-08-28 LAB — T-HELPER CELL (CD4) - (RCID CLINIC ONLY)
CD4 % Helper T Cell: 32 % — ABNORMAL LOW (ref 33–55)
CD4 T Cell Abs: 300 uL — ABNORMAL LOW (ref 400–2700)

## 2012-08-28 LAB — HIV-1 RNA QUANT-NO REFLEX-BLD: HIV-1 RNA Quant, Log: 2.36 {Log} — ABNORMAL HIGH (ref <1.30)

## 2012-09-03 ENCOUNTER — Encounter: Payer: Self-pay | Admitting: Obstetrics & Gynecology

## 2012-09-03 ENCOUNTER — Ambulatory Visit (INDEPENDENT_AMBULATORY_CARE_PROVIDER_SITE_OTHER): Payer: Medicaid Other | Admitting: Internal Medicine

## 2012-09-03 ENCOUNTER — Encounter: Payer: Self-pay | Admitting: Internal Medicine

## 2012-09-03 ENCOUNTER — Ambulatory Visit (INDEPENDENT_AMBULATORY_CARE_PROVIDER_SITE_OTHER): Payer: Medicaid Other | Admitting: Obstetrics & Gynecology

## 2012-09-03 VITALS — BP 122/77 | Temp 97.3°F | Wt 157.4 lb

## 2012-09-03 VITALS — BP 128/66 | HR 96 | Temp 98.1°F | Wt 158.0 lb

## 2012-09-03 DIAGNOSIS — O0991 Supervision of high risk pregnancy, unspecified, first trimester: Secondary | ICD-10-CM

## 2012-09-03 DIAGNOSIS — O98519 Other viral diseases complicating pregnancy, unspecified trimester: Secondary | ICD-10-CM

## 2012-09-03 DIAGNOSIS — B20 Human immunodeficiency virus [HIV] disease: Secondary | ICD-10-CM

## 2012-09-03 DIAGNOSIS — Z3490 Encounter for supervision of normal pregnancy, unspecified, unspecified trimester: Secondary | ICD-10-CM

## 2012-09-03 DIAGNOSIS — Z331 Pregnant state, incidental: Secondary | ICD-10-CM

## 2012-09-03 LAB — POCT URINALYSIS DIP (DEVICE)
Glucose, UA: NEGATIVE mg/dL
Leukocytes, UA: NEGATIVE
Nitrite: NEGATIVE
Urobilinogen, UA: 0.2 mg/dL (ref 0.0–1.0)

## 2012-09-03 NOTE — Progress Notes (Signed)
Viral load is low. [place] date is 02/09/13, will schedule detailed OB 19 weeks

## 2012-09-03 NOTE — Progress Notes (Signed)
U/S  Scheduled 09/09/12 at 1030 am.

## 2012-09-03 NOTE — Progress Notes (Signed)
Pulse: 92

## 2012-09-03 NOTE — Progress Notes (Signed)
  Subjective:    Patient ID: Diane Lowery, female    DOB: 1980/09/27, 32 y.o.   MRN: [id number]  HPI Diane Lowery is 31yo F with HIV, and in 2nd trimester in pregnancy, CD 4 count of 300/ VL 228, currently on truvada/ATVr. She has been taking the medications regularly now for the past 5 weeks. She is tolerating medicaiton without difficulty. Continues to go to her OB visits.  She has now made the decision to stay in town with her husband to relocate for [place] down to [place],   She is getting involved and connected with [facility], finding friends and social support for her HIV diagnosis.  Current Outpatient Prescriptions on File Prior to Visit  Medication Sig Dispense Refill  . atazanavir (REYATAZ) 300 MG capsule Take 1 capsule (300 mg total) by mouth daily with breakfast.  30 capsule  11  . emtricitabine-tenofovir (TRUVADA) 200-300 MG per tablet Take 1 tablet by mouth daily.  30 tablet  11  . ondansetron (ZOFRAN ODT) 4 MG disintegrating tablet Take 1 tablet (4 mg total) by mouth every 8 (eight) hours as needed for nausea.  30 tablet  0  . Prenatal Vit-Fe Fumarate-FA (PRENATAL MULTIVITAMIN) TABS Take 2 tablets by mouth daily at 12 noon.      . promethazine (PHENERGAN) 25 MG tablet Take 1 tablet (25 mg total) by mouth every 6 (six) hours as needed for nausea.  30 tablet  2  . ritonavir (NORVIR) 100 MG TABS Take 1 tablet (100 mg total) by mouth daily.  30 tablet  11  . SUMAtriptan (IMITREX) 50 MG tablet Take 1 tablet (50 mg total) by mouth every 2 (two) hours as needed for migraine.  30 tablet  1   No current facility-administered medications on file prior to visit.      Review of Systems     Objective:   Physical Exam BP 128/66  Pulse 96  Temp(Src) 98.1 F (36.7 C) (Oral)  Wt 158 lb (71.668 kg)  BMI 26.29 kg/m2  LMP 05/14/2012 No exam today       Assessment & Plan:  HIV= continue with excellent adherence on current regimen.   rtc in 4 wks, will do labs 1  wk prior to visit. Anticipate virologic control by then.

## 2012-09-03 NOTE — Patient Instructions (Signed)
Pregnancy - Second Trimester The second trimester of pregnancy (3 to 6 months) is a period of rapid growth for you and your baby. At the end of the sixth month, your baby is about 9 inches long and weighs 1 1/2 pounds. You will begin to feel the baby move between 18 and 20 weeks of the pregnancy. This is called quickening. Weight gain is faster. A clear fluid (colostrum) may leak out of your breasts. You may feel small contractions of the womb (uterus). This is known as false labor or Braxton-Hicks contractions. This is like a practice for labor when the baby is ready to be born. Usually, the problems with morning sickness have usually passed by the end of your first trimester. Some women develop small dark blotches (called cholasma, mask of pregnancy) on their face that usually goes away after the baby is born. Exposure to the sun makes the blotches worse. Acne may also develop in some pregnant women and pregnant women who have acne, may find that it goes away. PRENATAL EXAMS  Blood work may continue to be done during prenatal exams. These tests are done to check on your health and the probable health of your baby. Blood work is used to follow your blood levels (hemoglobin). Anemia (low hemoglobin) is common during pregnancy. Iron and vitamins are given to help prevent this. You will also be checked for diabetes between 24 and 28 weeks of the pregnancy. Some of the previous blood tests may be repeated.  The size of the uterus is measured during each visit. This is to make sure that the baby is continuing to grow properly according to the dates of the pregnancy.  Your blood pressure is checked every prenatal visit. This is to make sure you are not getting toxemia.  Your urine is checked to make sure you do not have an infection, diabetes or protein in the urine.  Your weight is checked often to make sure gains are happening at the suggested rate. This is to ensure that both you and your baby are  growing normally.  Sometimes, an ultrasound is performed to confirm the proper growth and development of the baby. This is a test which bounces harmless sound waves off the baby so your caregiver can more accurately determine due dates. Sometimes, a test is done on the amniotic fluid surrounding the baby. This test is called an amniocentesis. The amniotic fluid is obtained by sticking a needle into the belly (abdomen). This is done to check the chromosomes in instances where there is a concern about possible genetic problems with the baby. It is also sometimes done near the end of pregnancy if an early delivery is required. In this case, it is done to help make sure the baby's lungs are mature enough for the baby to live outside of the womb. CHANGES OCCURING IN THE SECOND TRIMESTER OF PREGNANCY Your body goes through many changes during pregnancy. They vary from person to person. Talk to your caregiver about changes you notice that you are concerned about.  During the second trimester, you will likely have an increase in your appetite. It is normal to have cravings for certain foods. This varies from person to person and pregnancy to pregnancy.  Your lower abdomen will begin to bulge.  You may have to urinate more often because the uterus and baby are pressing on your bladder. It is also common to get more bladder infections during pregnancy. You can help this by drinking lots of fluids   and emptying your bladder before and after intercourse.  You may begin to get stretch marks on your hips, abdomen, and breasts. These are normal changes in the body during pregnancy. There are no exercises or medicines to take that prevent this change.  You may begin to develop swollen and bulging veins (varicose veins) in your legs. Wearing support hose, elevating your feet for 15 minutes, 3 to 4 times a day and limiting salt in your diet helps lessen the problem.  Heartburn may develop as the uterus grows and  pushes up against the stomach. Antacids recommended by your caregiver helps with this problem. Also, eating smaller meals 4 to 5 times a day helps.  Constipation can be treated with a stool softener or adding bulk to your diet. Drinking lots of fluids, and eating vegetables, fruits, and whole grains are helpful.  Exercising is also helpful. If you have been very active up until your pregnancy, most of these activities can be continued during your pregnancy. If you have been less active, it is helpful to start an exercise program such as walking.  Hemorrhoids may develop at the end of the second trimester. Warm sitz baths and hemorrhoid cream recommended by your caregiver helps hemorrhoid problems.  Backaches may develop during this time of your pregnancy. Avoid heavy lifting, wear low heal shoes, and practice good posture to help with backache problems.  Some pregnant women develop tingling and numbness of their hand and fingers because of swelling and tightening of ligaments in the wrist (carpel tunnel syndrome). This goes away after the baby is born.  As your breasts enlarge, you may have to get a bigger bra. Get a comfortable, cotton, support bra. Do not get a nursing bra until the last month of the pregnancy if you will be nursing the baby.  You may get a dark line from your belly button to the pubic area called the linea nigra.  You may develop rosy cheeks because of increase blood flow to the face.  You may develop spider looking lines of the face, neck, arms, and chest. These go away after the baby is born. HOME CARE INSTRUCTIONS   It is extremely important to avoid all smoking, herbs, alcohol, and unprescribed drugs during your pregnancy. These chemicals affect the formation and growth of the baby. Avoid these chemicals throughout the pregnancy to ensure the delivery of a healthy infant.  Most of your home care instructions are the same as suggested for the first trimester of your  pregnancy. Keep your caregiver's appointments. Follow your caregiver's instructions regarding medicine use, exercise, and diet.  During pregnancy, you are providing food for you and your baby. Continue to eat regular, well-balanced meals. Choose foods such as meat, fish, milk and other low fat dairy products, vegetables, fruits, and whole-grain breads and cereals. Your caregiver will tell you of the ideal weight gain.  A physical sexual relationship may be continued up until near the end of pregnancy if there are no other problems. Problems could include early (premature) leaking of amniotic fluid from the membranes, vaginal bleeding, abdominal pain, or other medical or pregnancy problems.  Exercise regularly if there are no restrictions. Check with your caregiver if you are unsure of the safety of some of your exercises. The greatest weight gain will occur in the last 2 trimesters of pregnancy. Exercise will help you:  Control your weight.  Get you in shape for labor and delivery.  Lose weight after you have the baby.  Wear   a good support or jogging bra for breast tenderness during pregnancy. This may help if worn during sleep. Pads or tissues may be used in the bra if you are leaking colostrum.  Do not use hot tubs, steam rooms or saunas throughout the pregnancy.  Wear your seat belt at all times when driving. This protects you and your baby if you are in an accident.  Avoid raw meat, uncooked cheese, cat litter boxes, and soil used by cats. These carry germs that can cause birth defects in the baby.  The second trimester is also a good time to visit your dentist for your dental health if this has not been done yet. Getting your teeth cleaned is okay. Use a soft toothbrush. Brush gently during pregnancy.  It is easier to leak urine during pregnancy. Tightening up and strengthening the pelvic muscles will help with this problem. Practice stopping your urination while you are going to the  bathroom. These are the same muscles you need to strengthen. It is also the muscles you would use as if you were trying to stop from passing gas. You can practice tightening these muscles up 10 times a set and repeating this about 3 times per day. Once you know what muscles to tighten up, do not perform these exercises during urination. It is more likely to contribute to an infection by backing up the urine.  Ask for help if you have financial, counseling, or nutritional needs during pregnancy. Your caregiver will be able to offer counseling for these needs as well as refer you for other special needs.  Your skin may become oily. If so, wash your face with mild soap, use non-greasy moisturizer and oil or cream based makeup. MEDICINES AND DRUG USE IN PREGNANCY  Take prenatal vitamins as directed. The vitamin should contain 1 milligram of folic acid. Keep all vitamins out of reach of children. Only a couple vitamins or tablets containing iron may be fatal to a baby or young child when ingested.  Avoid use of all medicines, including herbs, over-the-counter medicines, not prescribed or suggested by your caregiver. Only take over-the-counter or prescription medicines for pain, discomfort, or fever as directed by your caregiver. Do not use aspirin.  Let your caregiver also know about herbs you may be using.  Alcohol is related to a number of birth defects. This includes fetal alcohol syndrome. All alcohol, in any form, should be avoided completely. Smoking will cause low birth rate and premature babies.  Street or illegal drugs are very harmful to the baby. They are absolutely forbidden. A baby born to an addicted mother will be addicted at birth. The baby will go through the same withdrawal an adult does. SEEK MEDICAL CARE IF:  You have any concerns or worries during your pregnancy. It is better to call with your questions if you feel they cannot wait, rather than worry about them. SEEK IMMEDIATE  MEDICAL CARE IF:   An unexplained oral temperature above 102 F (38.9 C) develops, or as your caregiver suggests.  You have leaking of fluid from the vagina (birth canal). If leaking membranes are suspected, take your temperature and tell your caregiver of this when you call.  There is vaginal spotting, bleeding, or passing clots. Tell your caregiver of the amount and how many pads are used. Light spotting in pregnancy is common, especially following intercourse.  You develop a bad smelling vaginal discharge with a change in the color from clear to white.  You continue to feel   sick to your stomach (nauseated) and have no relief from remedies suggested. You vomit blood or coffee ground-like materials.  You lose more than 2 pounds of weight or gain more than 2 pounds of weight over 1 week, or as suggested by your caregiver.  You notice swelling of your face, hands, feet, or legs.  You get exposed to German measles and have never had them.  You are exposed to fifth disease or chickenpox.  You develop belly (abdominal) pain. Round ligament discomfort is a common non-cancerous (benign) cause of abdominal pain in pregnancy. Your caregiver still must evaluate you.  You develop a bad headache that does not go away.  You develop fever, diarrhea, pain with urination, or shortness of breath.  You develop visual problems, blurry, or double vision.  You fall or are in a car accident or any kind of trauma.  There is mental or physical violence at home. Document Released: 03/05/2001 Document Revised: 12/04/2011 Document Reviewed: 09/07/2008 [employer] Patient Information 2014 [employer].  

## 2012-09-09 ENCOUNTER — Ambulatory Visit (HOSPITAL_COMMUNITY): Payer: Self-pay

## 2012-09-15 ENCOUNTER — Other Ambulatory Visit: Payer: Self-pay | Admitting: Obstetrics & Gynecology

## 2012-09-15 ENCOUNTER — Ambulatory Visit (HOSPITAL_COMMUNITY)
Admission: RE | Admit: 2012-09-15 | Discharge: 2012-09-15 | Disposition: A | Discharge status: Home / Self Care | Payer: Medicaid Other | Source: Ambulatory Visit | Attending: Obstetrics & Gynecology | Admitting: Obstetrics & Gynecology

## 2012-09-15 DIAGNOSIS — O09299 Supervision of pregnancy with other poor reproductive or obstetric history, unspecified trimester: Secondary | ICD-10-CM | POA: Insufficient documentation

## 2012-09-15 DIAGNOSIS — Z3689 Encounter for other specified antenatal screening: Secondary | ICD-10-CM | POA: Insufficient documentation

## 2012-09-15 DIAGNOSIS — O0991 Supervision of high risk pregnancy, unspecified, first trimester: Secondary | ICD-10-CM

## 2012-09-17 ENCOUNTER — Ambulatory Visit (INDEPENDENT_AMBULATORY_CARE_PROVIDER_SITE_OTHER): Payer: Medicaid Other | Admitting: Obstetrics & Gynecology

## 2012-09-17 VITALS — BP 126/78 | Temp 97.3°F | Wt 160.0 lb

## 2012-09-17 DIAGNOSIS — O98519 Other viral diseases complicating pregnancy, unspecified trimester: Secondary | ICD-10-CM

## 2012-09-17 DIAGNOSIS — B20 Human immunodeficiency virus [HIV] disease: Secondary | ICD-10-CM

## 2012-09-17 DIAGNOSIS — O98712 Human immunodeficiency virus [HIV] disease complicating pregnancy, second trimester: Secondary | ICD-10-CM

## 2012-09-17 DIAGNOSIS — O0991 Supervision of high risk pregnancy, unspecified, first trimester: Secondary | ICD-10-CM

## 2012-09-17 LAB — POCT URINALYSIS DIP (DEVICE)
Bilirubin Urine: NEGATIVE
Glucose, UA: NEGATIVE mg/dL
Nitrite: NEGATIVE
pH: 7.5 (ref 5.0–8.0)

## 2012-09-17 NOTE — Progress Notes (Signed)
Pulse- 87 

## 2012-09-17 NOTE — Progress Notes (Signed)
Normal anatomy scan.  Last viral load count was 229 on 08/27/12, increased from prior result.  Will continue to be treated by ID.  No other complaints or concerns.  Routine obstetric precautions reviewed.

## 2012-09-17 NOTE — Patient Instructions (Signed)
Return to clinic for any obstetric concerns or go to [facility] for evaluation  

## 2012-09-24 ENCOUNTER — Other Ambulatory Visit: Payer: Self-pay

## 2012-10-08 ENCOUNTER — Encounter: Payer: Self-pay | Admitting: Internal Medicine

## 2012-10-08 ENCOUNTER — Ambulatory Visit (INDEPENDENT_AMBULATORY_CARE_PROVIDER_SITE_OTHER): Payer: Medicaid Other | Admitting: Internal Medicine

## 2012-10-08 VITALS — BP 130/82 | HR 74 | Temp 98.0°F

## 2012-10-08 DIAGNOSIS — B20 Human immunodeficiency virus [HIV] disease: Secondary | ICD-10-CM

## 2012-10-08 MED ORDER — ELVITEG-COBIC-EMTRICIT-TENOFDF 150-150-200-300 MG PO TABS: 1.0000 | ORAL_TABLET | Freq: Every day | ORAL | Status: AC

## 2012-10-08 NOTE — Progress Notes (Signed)
[facility] NOTE  RFV: routine Subjective:    Patient ID: Diane Lowery, female    DOB: 06-Dec-1980, 32 y.o.   MRN: [id number]  HPI 31yo F with HIV in her 26th wk gestation , CD 4 count of 300/VL 229, currently on truvada-boosted atazanavir. Stopped taking HIV medications early July due to persistent reflux despite taking medications. She denies any nausea, she also states that her reflux has stopped since she discontinued HAART.she states that she would like to go back to taking stribild since it was not associated with any GI symptoms   It is unclear why she did not call the clinic to let us know why she stopped taking her HIV meds, nor did she disclose this to her case manager who checks in with her twice a week.  She states that she is getting great support from the women's group, and higher grounds gathering. She is also happy that her husband is now relocated, living in [place]. He is attempting to find a job. They are currently living at her first child's father's home temporarily.  Her husband is here in clinic with her today and interested in HIV testing. Never has been tested before.  Current Outpatient Prescriptions on File Prior to Visit  Medication Sig Dispense Refill  . atazanavir (REYATAZ) 300 MG capsule Take 1 capsule (300 mg total) by mouth daily with breakfast.  30 capsule  11  . emtricitabine-tenofovir (TRUVADA) 200-300 MG per tablet Take 1 tablet by mouth daily.  30 tablet  11  . ondansetron (ZOFRAN ODT) 4 MG disintegrating tablet Take 1 tablet (4 mg total) by mouth every 8 (eight) hours as needed for nausea.  30 tablet  0  . Prenatal Vit-Fe Fumarate-FA (PRENATAL MULTIVITAMIN) TABS Take 2 tablets by mouth daily at 12 noon.      . promethazine (PHENERGAN) 25 MG tablet Take 1 tablet (25 mg total) by mouth every 6 (six) hours as needed for nausea.  30 tablet  2  . ritonavir (NORVIR) 100 MG TABS Take 1 tablet (100 mg total) by mouth daily.  30 tablet  11  . SUMAtriptan  (IMITREX) 50 MG tablet Take 1 tablet (50 mg total) by mouth every 2 (two) hours as needed for migraine.  30 tablet  1   No current facility-administered medications on file prior to visit.   Active Ambulatory Problems    Diagnosis Date Noted  . HIV DISEASE 10/28/2006  . HSV 10/28/2006  . 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 MIGRAINE, UNSPEC., W/O INTRACTABLE MIGRAINE 05/22/2006  . 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 Supervision of high risk pregnancy in first trimester 07/23/2012  . Maternal HIV positive complicating pregnancy, antepartum 07/23/2012   Resolved Ambulatory Problems    Diagnosis Date Noted  . Abdominal pregnancy with intrauterine pregnancy 06/15/2012   Past Medical History  Diagnosis Date  . HIV infection   . History of chicken pox   . Migraines   . Gestational diabetes     History  Substance Use Topics  . Smoking status: Never Smoker   . Smokeless tobacco: Never Used  . Alcohol Use: No  family history includes Hypertension in her mother and sister and Stroke in her maternal aunt.  There is no history of Cancer, and Diabetes, and Early death, and Hyperlipidemia, .   Review of Systems  Constitutional: Negative for fever, chills, diaphoresis, activity change, appetite change, fatigue and unexpected weight change.  HENT: Negative for congestion, sore throat,  rhinorrhea, sneezing, trouble swallowing and sinus pressure.  Eyes: Negative for photophobia and visual disturbance.  Respiratory: Negative for cough, chest tightness, shortness of breath, wheezing and stridor.  Cardiovascular: Negative for chest pain, palpitations and leg swelling.  Gastrointestinal: positive for reflux. Negative for nausea, vomiting, abdominal pain, diarrhea, constipation, blood in stool, abdominal distention and anal bleeding.  Genitourinary: Negative for dysuria, hematuria, flank pain and difficulty urinating.  Musculoskeletal: Negative for myalgias, back pain, joint  swelling, arthralgias and gait problem.  Skin: Negative for color change, pallor, rash and wound.  Neurological: Negative for dizziness, tremors, weakness and light-headedness.  Hematological: Negative for adenopathy. Does not bruise/bleed easily.  Psychiatric/Behavioral: Negative for behavioral problems, confusion, sleep disturbance, dysphoric mood, decreased concentration and agitation.        Objective:   Physical Exam BP 130/82  Pulse 74  Temp(Src) 98 F (36.7 C) (Oral)  LMP 05/14/2012 Physical Exam  Constitutional:  oriented to person, place, and time.  appears well-developed and well-nourished. No distress.  HENT:  Mouth/Throat: Oropharynx is clear and moist. No oropharyngeal exudate.  Cardiovascular: Normal rate, regular rhythm and normal heart sounds. Exam reveals no gallop and no friction rub.  No murmur heard.  Pulmonary/Chest: Effort normal and breath sounds normal. No respiratory distress. He has no wheezes.  Abdominal: gravid abdomen Lymphadenopathy: no cervical adenopathy.  Neurological:  alert and oriented to person, place, and time.  Skin: Skin is warm and dry. No rash noted. No erythema.  Psychiatric:tearful during discussion.      Assessment & Plan:  Non-adherence = had a prolonged discussion to the gravity of stopping hiv medication during her pregnancy and risk for perinatal transmission. i have mentioned that there is not much data available to the safety of stribild on pregnancy but likely less so since she is out of first trimester. We have contact the [facility] HIV perinatal hotline during her first trimester where we decided to change her to truvada/atazanavir/ritonavir. Since she feels she is unable to tolerate taking pi based regimen, we will start her back on stribild. We will enroll in the stribild pregnancy registry.  We will see her on a weekly basis to ensure she is doing ok with HIV meds until her delivery  45 min spent with patient and greater than 50%  spent on Adherence counseling  hiv = will get cd 4 count and viral load next week. Start on stribild  Pregnancy = continue with pre-natals  hiv prevention = counseled her and her husband on protected sex. Will do partner testing today  rtc 1 wk x 4

## 2012-10-15 ENCOUNTER — Ambulatory Visit: Payer: Self-pay | Admitting: Internal Medicine

## 2012-10-15 ENCOUNTER — Encounter: Payer: Self-pay | Admitting: *Deleted

## 2012-10-15 ENCOUNTER — Ambulatory Visit (INDEPENDENT_AMBULATORY_CARE_PROVIDER_SITE_OTHER): Payer: Self-pay | Admitting: Obstetrics & Gynecology

## 2012-10-15 ENCOUNTER — Other Ambulatory Visit (HOSPITAL_COMMUNITY)
Admission: RE | Admit: 2012-10-15 | Discharge: 2012-10-15 | Disposition: A | Discharge status: Home / Self Care | Payer: Medicaid Other | Source: Ambulatory Visit | Attending: Obstetrics & Gynecology | Admitting: Obstetrics & Gynecology

## 2012-10-15 VITALS — BP 127/83 | Temp 98.9°F | Wt 170.0 lb

## 2012-10-15 DIAGNOSIS — D069 Carcinoma in situ of cervix, unspecified: Secondary | ICD-10-CM | POA: Insufficient documentation

## 2012-10-15 DIAGNOSIS — O0991 Supervision of high risk pregnancy, unspecified, first trimester: Secondary | ICD-10-CM

## 2012-10-15 DIAGNOSIS — O98519 Other viral diseases complicating pregnancy, unspecified trimester: Secondary | ICD-10-CM

## 2012-10-15 DIAGNOSIS — O3440 Maternal care for other abnormalities of cervix, unspecified trimester: Secondary | ICD-10-CM | POA: Insufficient documentation

## 2012-10-15 DIAGNOSIS — R6889 Other general symptoms and signs: Secondary | ICD-10-CM

## 2012-10-15 DIAGNOSIS — IMO0002 Reserved for concepts with insufficient information to code with codable children: Secondary | ICD-10-CM

## 2012-10-15 LAB — POCT URINALYSIS DIP (DEVICE)
Bilirubin Urine: NEGATIVE
Glucose, UA: NEGATIVE mg/dL
Ketones, ur: NEGATIVE mg/dL
Leukocytes, UA: NEGATIVE
Protein, ur: NEGATIVE mg/dL

## 2012-10-15 NOTE — Progress Notes (Signed)
Colposcopy done for LSIL pap. Bx done Patient ID: Diane Lowery, female   DOB: 1980/06/09, 32 y.o.   MRN: [id number]  Chief Complaint  Patient presents with  . Routine Prenatal Visit    HPI Diane Lowery is a 32 y.o. female.  G2P1001   HPI  Indications: Pap smear on March 2014 showed: low-grade squamous intraepithelial neoplasia (LGSIL - encompassing HPV,mild dysplasia,CIN I). Previous colposcopy: CIN 1 and in 2009. Prior cervical treatment: no treatment.  Past Medical History  Diagnosis Date  . HIV infection   . History of chicken pox   . Migraines     Excedrin migraine or immitrex  . Gestational diabetes     diet controlled    Past Surgical History  Procedure Laterality Date  . No past surgeries      Family History  Problem Relation Age of Onset  . Stroke Maternal Aunt   . Hypertension Mother   . Hypertension Sister   . Cancer Neg Hx   . Diabetes Neg Hx   . Early death Neg Hx   . Hyperlipidemia Neg Hx     Social History History  Substance Use Topics  . Smoking status: Never Smoker   . Smokeless tobacco: Never Used  . Alcohol Use: No    No Known Allergies  Current Outpatient Prescriptions  Medication Sig Dispense Refill  . elvitegravir-cobicistat-emtricitabine-tenofovir (STRIBILD) 150-150-200-300 MG TABS Take 1 tablet by mouth daily with breakfast.  30 tablet  11  . ondansetron (ZOFRAN ODT) 4 MG disintegrating tablet Take 1 tablet (4 mg total) by mouth every 8 (eight) hours as needed for nausea.  30 tablet  0  . Prenatal Vit-Fe Fumarate-FA (PRENATAL MULTIVITAMIN) TABS Take 2 tablets by mouth daily at 12 noon.      . promethazine (PHENERGAN) 25 MG tablet Take 1 tablet (25 mg total) by mouth every 6 (six) hours as needed for nausea.  30 tablet  2   No current facility-administered medications for this visit.    Review of Systems Review of Systems  Blood pressure 127/83, temperature 98.9 F (37.2 C), weight 170 lb (77.111 kg), last menstrual  period 05/14/2012.  Physical Exam Physical Exam  Data Reviewed cx log closed, large lesion  Assessment    Procedure Details  The risks and benefits of the procedure and Written informed consent obtained.  Speculum placed in vagina and excellent visualization of cervix achieved, cervix swabbed x 3 with acetic acid solution.  Specimens: Bx at 12 white lesion at anterior os, AWE over entire TZ, mosaicism  Complications: none.     Plan    Specimens labelled and sent to Pathology. Return to discuss Pathology results in 2 weeks.  Adam Phenix, MD       Diane Lowery,Diane Lowery 10/15/2012, 11:18 AM

## 2012-10-15 NOTE — Progress Notes (Signed)
Pulse: 81

## 2012-10-15 NOTE — Patient Instructions (Signed)
Colposcopy Colposcopy is a procedure that uses a special lighted microscope (colposcope). It examines your cervix and vagina, or the area around the outside of the vagina, for signs of disease or abnormalities in the cells. You may be sent to a specialist (gynecologist) to do the colposcopy. A biopsy (tissue sample) may be collected during a colposcopy, if the caregiver finds any unusual cells. The biopsy is sent to the lab for further testing, and the results are reported back to your caregiver. A WOMAN MAY NEED THIS PROCEDURE IF:  She has had an abnormal pap smear (taking cells from the cervix for testing).  She has a sore on her cervix, and a Pap test was normal.  The Pap test suggests human papilloma virus (HPV). This virus can cause genital warts and is linked to the development of cervical cancer.  She has genital warts on the cervix, or in or around the outside of the vagina.  Her mother took the drug DES while pregnant.  She has painful intercourse.  She has vaginal bleeding, especially after sexual intercourse.  There is a need to evaluate the results of previous treatment. BEFORE THE PROCEDURE   Colposcopy is done when you are not having a menstrual period.  For 24 hours before the colposcopy, do not:  Douche.  Use tampons.  Use medicines, creams, or suppositories in the vagina.  Have sexual intercourse. PROCEDURE   A colposcopy is done while a woman is lying on her back with her feet in foot rests (stirrups).  A speculum is placed inside the vagina to keep it open and to allow the caregiver to see the cervix. This is the same instrument used to do a pap smear.  The colposcope is placed outside the vagina. It is used to magnify and examine the cervix, vagina, and the area around the outside of the vagina.  A small amount of liquid solution is placed on the area that is to be viewed. This solution is placed on with a cotton applicator. This solution makes it easier to  see the abnormal cells.  Your caregiver will suck out mucus and cells from the canal of the cervix.  Small pieces of tissue for biopsy may be taken at the same time. You may feel mild pain or discomfort when this is done.  Your caregiver will record the location of the abnormal areas and send the tissue samples to a lab for analysis.  If your caregiver biopsies the vagina or outside of the vagina, a local anesthetic (novocaine) is usually given. AFTER THE PROCEDURE   You may have some cramping that often goes away in a few minutes. You may have some soreness for a couple of days.  You may take over-the-counter pain medicine as advised by your caregiver. Do not take aspirin because it can cause bleeding.  Lie down for a few minutes if you feel lightheaded.  You may have some bleeding or dark discharge that should stop in a few days.  You may need to wear a sanitary pad for a few days. HOME CARE INSTRUCTIONS   Avoid sex, douching, and using tampons for a week or as directed.  Only take medicine as directed by your caregiver.  Continue to take birth control pills, if you are on them.  Not all test results are available during your visit. If your test results are not back during the visit, make an appointment with your caregiver to find out the results. Do not assume everything is   normal if you have not heard from your caregiver or the medical facility. It is important for you to follow up on all of your test results.  Follow your caregiver's advice regarding medicines, activity, follow-up visits, and follow-up Pap tests. SEEK MEDICAL CARE IF:   You develop a rash.  You have problems with your medicine. SEEK IMMEDIATE MEDICAL CARE IF:  You are bleeding heavily or are passing blood clots.  You develop a fever over 102 F (38.9 C), with or without chills.  You have abnormal vaginal discharge.  You are having cramps that do not go away after taking your pain medicine.  You  feel lightheaded, dizzy, or faint.  You develop stomach pain. Document Released: 06/01/2002 Document Revised: 06/03/2011 Document Reviewed: 01/12/2009 Exit[employer] Patient Information 2014 [employer].  

## 2012-10-19 ENCOUNTER — Ambulatory Visit (INDEPENDENT_AMBULATORY_CARE_PROVIDER_SITE_OTHER): Payer: Medicaid Other | Admitting: Internal Medicine

## 2012-10-19 ENCOUNTER — Encounter: Payer: Self-pay | Admitting: Internal Medicine

## 2012-10-19 VITALS — BP 135/88 | HR 78 | Temp 98.2°F | Wt 170.0 lb

## 2012-10-19 DIAGNOSIS — O98519 Other viral diseases complicating pregnancy, unspecified trimester: Secondary | ICD-10-CM

## 2012-10-19 DIAGNOSIS — O98712 Human immunodeficiency virus [HIV] disease complicating pregnancy, second trimester: Secondary | ICD-10-CM

## 2012-10-19 DIAGNOSIS — B20 Human immunodeficiency virus [HIV] disease: Secondary | ICD-10-CM

## 2012-10-21 NOTE — Progress Notes (Signed)
  Subjective:    Patient ID: Diane Lowery, female    DOB: 1980/05/20, 32 y.o.   MRN: [id number]  HPI 32yo F with HIV, and in 2nd trimester of pregnancy, Cd 4 count of 300/VL 229, she was switched to stribild 10 days ago since she had voluntarily stopped her regimen of truvada-boosted atazanavir due to SE of Gerd. She states that she is doing better. Has not missed any of the doses thus far.  Current Outpatient Prescriptions on File Prior to Visit  Medication Sig Dispense Refill  . elvitegravir-cobicistat-emtricitabine-tenofovir (STRIBILD) 150-150-200-300 MG TABS Take 1 tablet by mouth daily with breakfast.  30 tablet  11  . Prenatal Vit-Fe Fumarate-FA (PRENATAL MULTIVITAMIN) TABS Take 2 tablets by mouth daily at 12 noon.      . ondansetron (ZOFRAN ODT) 4 MG disintegrating tablet Take 1 tablet (4 mg total) by mouth every 8 (eight) hours as needed for nausea.  30 tablet  0  . promethazine (PHENERGAN) 25 MG tablet Take 1 tablet (25 mg total) by mouth every 6 (six) hours as needed for nausea.  30 tablet  2   No current facility-administered medications on file prior to visit.   Active Ambulatory Problems    Diagnosis Date Noted  . HIV DISEASE 10/28/2006  . HSV 10/28/2006  . 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 MIGRAINE, UNSPEC., W/O INTRACTABLE MIGRAINE 05/22/2006  . 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 Supervision of high risk pregnancy in first trimester 07/23/2012  . Maternal HIV positive complicating pregnancy, antepartum 07/23/2012   Resolved Ambulatory Problems    Diagnosis Date Noted  . Abdominal pregnancy with intrauterine pregnancy 06/15/2012   Past Medical History  Diagnosis Date  . HIV infection   . History of chicken pox   . Migraines   . Gestational diabetes        Review of Systems 12 point ROS is negative except for reflux    Objective:   Physical Exam BP 135/88  Pulse 78  Temp(Src) 98.2 F (36.8 C) (Oral)  Wt 170 lb (77.111 kg)   BMI 28.29 kg/m2  LMP 05/14/2012 Physical Exam  Constitutional:  oriented to person, place, and time.  appears well-developed and well-nourished. No distress.  HENT:  Mouth/Throat: Oropharynx is clear and moist. No oropharyngeal exudate.  Cardiovascular: Normal rate, regular rhythm and normal heart sounds. Exam reveals no gallop and no friction rub.  No murmur heard.  Pulmonary/Chest: Effort normal and breath sounds normal. No respiratory distress.  no wheezes.  Abdominal: Soft. Bowel sounds are normal. +gravid  Lymphadenopathy:  no cervical adenopathy.  Neurological: He is alert and oriented to person, place, and time.  Skin: Skin is warm and dry. No rash noted. No erythema.  Psychiatric:  normal mood and affect. behavior is normal.       Assessment & Plan:  HIV during pregnancy with detectable viral load = continue on stribild. Will check cd 4 count and viral load in 2 wks when she has been on current regimen x 4 wks.   Pregnancy = continue with prenatal multivitamin  GERD= continue with prilosec prn  Spent 30 min with patient 50% of which spent on adherence counseling  rtc in 2 wk

## 2012-10-29 ENCOUNTER — Ambulatory Visit (INDEPENDENT_AMBULATORY_CARE_PROVIDER_SITE_OTHER): Payer: Medicaid Other | Admitting: Family Medicine

## 2012-10-29 VITALS — BP 143/94 | Temp 97.4°F | Wt 171.2 lb

## 2012-10-29 DIAGNOSIS — Z21 Asymptomatic human immunodeficiency virus [HIV] infection status: Secondary | ICD-10-CM

## 2012-10-29 DIAGNOSIS — O98519 Other viral diseases complicating pregnancy, unspecified trimester: Secondary | ICD-10-CM

## 2012-10-29 LAB — POCT URINALYSIS DIP (DEVICE)
Glucose, UA: NEGATIVE mg/dL
Specific Gravity, Urine: 1.02 (ref 1.005–1.030)
Urobilinogen, UA: 0.2 mg/dL (ref 0.0–1.0)

## 2012-10-29 NOTE — Progress Notes (Signed)
Pulse- 80  Edema-feet  Pain/pressure-lower back

## 2012-10-29 NOTE — Progress Notes (Signed)
Ultrasound scheduled for 11/05/12 at 8:45 am.

## 2012-10-29 NOTE — Patient Instructions (Signed)
Pregnancy - Second Trimester The second trimester of pregnancy (3 to 6 months) is a period of rapid growth for you and your baby. At the end of the sixth month, your baby is about 9 inches long and weighs 1 1/2 pounds. You will begin to feel the baby move between 18 and 20 weeks of the pregnancy. This is called quickening. Weight gain is faster. A clear fluid (colostrum) may leak out of your breasts. You may feel small contractions of the womb (uterus). This is known as false labor or Braxton-Hicks contractions. This is like a practice for labor when the baby is ready to be born. Usually, the problems with morning sickness have usually passed by the end of your first trimester. Some women develop small dark blotches (called cholasma, mask of pregnancy) on their face that usually goes away after the baby is born. Exposure to the sun makes the blotches worse. Acne may also develop in some pregnant women and pregnant women who have acne, may find that it goes away. PRENATAL EXAMS  Blood work may continue to be done during prenatal exams. These tests are done to check on your health and the probable health of your baby. Blood work is used to follow your blood levels (hemoglobin). Anemia (low hemoglobin) is common during pregnancy. Iron and vitamins are given to help prevent this. You will also be checked for diabetes between 24 and 28 weeks of the pregnancy. Some of the previous blood tests may be repeated.  The size of the uterus is measured during each visit. This is to make sure that the baby is continuing to grow properly according to the dates of the pregnancy.  Your blood pressure is checked every prenatal visit. This is to make sure you are not getting toxemia.  Your urine is checked to make sure you do not have an infection, diabetes or protein in the urine.  Your weight is checked often to make sure gains are happening at the suggested rate. This is to ensure that both you and your baby are  growing normally.  Sometimes, an ultrasound is performed to confirm the proper growth and development of the baby. This is a test which bounces harmless sound waves off the baby so your caregiver can more accurately determine due dates. Sometimes, a test is done on the amniotic fluid surrounding the baby. This test is called an amniocentesis. The amniotic fluid is obtained by sticking a needle into the belly (abdomen). This is done to check the chromosomes in instances where there is a concern about possible genetic problems with the baby. It is also sometimes done near the end of pregnancy if an early delivery is required. In this case, it is done to help make sure the baby's lungs are mature enough for the baby to live outside of the womb. CHANGES OCCURING IN THE SECOND TRIMESTER OF PREGNANCY Your body goes through many changes during pregnancy. They vary from person to person. Talk to your caregiver about changes you notice that you are concerned about.  During the second trimester, you will likely have an increase in your appetite. It is normal to have cravings for certain foods. This varies from person to person and pregnancy to pregnancy.  Your lower abdomen will begin to bulge.  You may have to urinate more often because the uterus and baby are pressing on your bladder. It is also common to get more bladder infections during pregnancy. You can help this by drinking lots of fluids   and emptying your bladder before and after intercourse.  You may begin to get stretch marks on your hips, abdomen, and breasts. These are normal changes in the body during pregnancy. There are no exercises or medicines to take that prevent this change.  You may begin to develop swollen and bulging veins (varicose veins) in your legs. Wearing support hose, elevating your feet for 15 minutes, 3 to 4 times a day and limiting salt in your diet helps lessen the problem.  Heartburn may develop as the uterus grows and  pushes up against the stomach. Antacids recommended by your caregiver helps with this problem. Also, eating smaller meals 4 to 5 times a day helps.  Constipation can be treated with a stool softener or adding bulk to your diet. Drinking lots of fluids, and eating vegetables, fruits, and whole grains are helpful.  Exercising is also helpful. If you have been very active up until your pregnancy, most of these activities can be continued during your pregnancy. If you have been less active, it is helpful to start an exercise program such as walking.  Hemorrhoids may develop at the end of the second trimester. Warm sitz baths and hemorrhoid cream recommended by your caregiver helps hemorrhoid problems.  Backaches may develop during this time of your pregnancy. Avoid heavy lifting, wear low heal shoes, and practice good posture to help with backache problems.  Some pregnant women develop tingling and numbness of their hand and fingers because of swelling and tightening of ligaments in the wrist (carpel tunnel syndrome). This goes away after the baby is born.  As your breasts enlarge, you may have to get a bigger bra. Get a comfortable, cotton, support bra. Do not get a nursing bra until the last month of the pregnancy if you will be nursing the baby.  You may get a dark line from your belly button to the pubic area called the linea nigra.  You may develop rosy cheeks because of increase blood flow to the face.  You may develop spider looking lines of the face, neck, arms, and chest. These go away after the baby is born. HOME CARE INSTRUCTIONS   It is extremely important to avoid all smoking, herbs, alcohol, and unprescribed drugs during your pregnancy. These chemicals affect the formation and growth of the baby. Avoid these chemicals throughout the pregnancy to ensure the delivery of a healthy infant.  Most of your home care instructions are the same as suggested for the first trimester of your  pregnancy. Keep your caregiver's appointments. Follow your caregiver's instructions regarding medicine use, exercise, and diet.  During pregnancy, you are providing food for you and your baby. Continue to eat regular, well-balanced meals. Choose foods such as meat, fish, milk and other low fat dairy products, vegetables, fruits, and whole-grain breads and cereals. Your caregiver will tell you of the ideal weight gain.  A physical sexual relationship may be continued up until near the end of pregnancy if there are no other problems. Problems could include early (premature) leaking of amniotic fluid from the membranes, vaginal bleeding, abdominal pain, or other medical or pregnancy problems.  Exercise regularly if there are no restrictions. Check with your caregiver if you are unsure of the safety of some of your exercises. The greatest weight gain will occur in the last 2 trimesters of pregnancy. Exercise will help you:  Control your weight.  Get you in shape for labor and delivery.  Lose weight after you have the baby.  Wear   a good support or jogging bra for breast tenderness during pregnancy. This may help if worn during sleep. Pads or tissues may be used in the bra if you are leaking colostrum.  Do not use hot tubs, steam rooms or saunas throughout the pregnancy.  Wear your seat belt at all times when driving. This protects you and your baby if you are in an accident.  Avoid raw meat, uncooked cheese, cat litter boxes, and soil used by cats. These carry germs that can cause birth defects in the baby.  The second trimester is also a good time to visit your dentist for your dental health if this has not been done yet. Getting your teeth cleaned is okay. Use a soft toothbrush. Brush gently during pregnancy.  It is easier to leak urine during pregnancy. Tightening up and strengthening the pelvic muscles will help with this problem. Practice stopping your urination while you are going to the  bathroom. These are the same muscles you need to strengthen. It is also the muscles you would use as if you were trying to stop from passing gas. You can practice tightening these muscles up 10 times a set and repeating this about 3 times per day. Once you know what muscles to tighten up, do not perform these exercises during urination. It is more likely to contribute to an infection by backing up the urine.  Ask for help if you have financial, counseling, or nutritional needs during pregnancy. Your caregiver will be able to offer counseling for these needs as well as refer you for other special needs.  Your skin may become oily. If so, wash your face with mild soap, use non-greasy moisturizer and oil or cream based makeup. MEDICINES AND DRUG USE IN PREGNANCY  Take prenatal vitamins as directed. The vitamin should contain 1 milligram of folic acid. Keep all vitamins out of reach of children. Only a couple vitamins or tablets containing iron may be fatal to a baby or young child when ingested.  Avoid use of all medicines, including herbs, over-the-counter medicines, not prescribed or suggested by your caregiver. Only take over-the-counter or prescription medicines for pain, discomfort, or fever as directed by your caregiver. Do not use aspirin.  Let your caregiver also know about herbs you may be using.  Alcohol is related to a number of birth defects. This includes fetal alcohol syndrome. All alcohol, in any form, should be avoided completely. Smoking will cause low birth rate and premature babies.  Street or illegal drugs are very harmful to the baby. They are absolutely forbidden. A baby born to an addicted mother will be addicted at birth. The baby will go through the same withdrawal an adult does. SEEK MEDICAL CARE IF:  You have any concerns or worries during your pregnancy. It is better to call with your questions if you feel they cannot wait, rather than worry about them. SEEK IMMEDIATE  MEDICAL CARE IF:   An unexplained oral temperature above 102 F (38.9 C) develops, or as your caregiver suggests.  You have leaking of fluid from the vagina (birth canal). If leaking membranes are suspected, take your temperature and tell your caregiver of this when you call.  There is vaginal spotting, bleeding, or passing clots. Tell your caregiver of the amount and how many pads are used. Light spotting in pregnancy is common, especially following intercourse.  You develop a bad smelling vaginal discharge with a change in the color from clear to white.  You continue to feel   sick to your stomach (nauseated) and have no relief from remedies suggested. You vomit blood or coffee ground-like materials.  You lose more than 2 pounds of weight or gain more than 2 pounds of weight over 1 week, or as suggested by your caregiver.  You notice swelling of your face, hands, feet, or legs.  You get exposed to German measles and have never had them.  You are exposed to fifth disease or chickenpox.  You develop belly (abdominal) pain. Round ligament discomfort is a common non-cancerous (benign) cause of abdominal pain in pregnancy. Your caregiver still must evaluate you.  You develop a bad headache that does not go away.  You develop fever, diarrhea, pain with urination, or shortness of breath.  You develop visual problems, blurry, or double vision.  You fall or are in a car accident or any kind of trauma.  There is mental or physical violence at home. Document Released: 03/05/2001 Document Revised: 12/04/2011 Document Reviewed: 09/07/2008 [employer] Patient Information 2014 [employer].  

## 2012-10-29 NOTE — Progress Notes (Signed)
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 Taking meds--seeing ID U/S for growth 1 wk 28 wk labs next visit.

## 2012-11-05 ENCOUNTER — Inpatient Hospital Stay (HOSPITAL_COMMUNITY)
Admission: AD | Admit: 2012-11-05 | Discharge: 2012-11-11 | DRG: 781 | Payer: Medicaid Other | Source: Ambulatory Visit | Attending: Family Medicine | Admitting: Family Medicine

## 2012-11-05 ENCOUNTER — Other Ambulatory Visit: Payer: Self-pay | Admitting: Family Medicine

## 2012-11-05 ENCOUNTER — Encounter (HOSPITAL_COMMUNITY): Payer: Self-pay | Admitting: *Deleted

## 2012-11-05 ENCOUNTER — Ambulatory Visit (HOSPITAL_COMMUNITY)
Admission: RE | Admit: 2012-11-05 | Discharge: 2012-11-05 | Disposition: A | Discharge status: Home / Self Care | Payer: Medicaid Other | Source: Ambulatory Visit | Attending: Family Medicine | Admitting: Family Medicine

## 2012-11-05 DIAGNOSIS — O133 Gestational [pregnancy-induced] hypertension without significant proteinuria, third trimester: Secondary | ICD-10-CM

## 2012-11-05 DIAGNOSIS — O3432 Maternal care for cervical incompetence, second trimester: Secondary | ICD-10-CM

## 2012-11-05 DIAGNOSIS — O365921 Maternal care for other known or suspected poor fetal growth, second trimester, fetus 1: Secondary | ICD-10-CM

## 2012-11-05 DIAGNOSIS — O98519 Other viral diseases complicating pregnancy, unspecified trimester: Secondary | ICD-10-CM | POA: Diagnosis present

## 2012-11-05 DIAGNOSIS — B20 Human immunodeficiency virus [HIV] disease: Secondary | ICD-10-CM

## 2012-11-05 DIAGNOSIS — O365990 Maternal care for other known or suspected poor fetal growth, unspecified trimester, not applicable or unspecified: Secondary | ICD-10-CM | POA: Diagnosis present

## 2012-11-05 DIAGNOSIS — Z21 Asymptomatic human immunodeficiency virus [HIV] infection status: Secondary | ICD-10-CM | POA: Insufficient documentation

## 2012-11-05 DIAGNOSIS — R109 Unspecified abdominal pain: Secondary | ICD-10-CM | POA: Diagnosis not present

## 2012-11-05 DIAGNOSIS — O3440 Maternal care for other abnormalities of cervix, unspecified trimester: Secondary | ICD-10-CM | POA: Diagnosis present

## 2012-11-05 DIAGNOSIS — O98712 Human immunodeficiency virus [HIV] disease complicating pregnancy, second trimester: Secondary | ICD-10-CM

## 2012-11-05 DIAGNOSIS — D069 Carcinoma in situ of cervix, unspecified: Secondary | ICD-10-CM | POA: Diagnosis present

## 2012-11-05 DIAGNOSIS — O368390 Maternal care for abnormalities of the fetal heart rate or rhythm, unspecified trimester, not applicable or unspecified: Secondary | ICD-10-CM | POA: Diagnosis present

## 2012-11-05 DIAGNOSIS — O212 Late vomiting of pregnancy: Secondary | ICD-10-CM | POA: Diagnosis not present

## 2012-11-05 DIAGNOSIS — O1410 Severe pre-eclampsia, unspecified trimester: Principal | ICD-10-CM | POA: Diagnosis present

## 2012-11-05 DIAGNOSIS — O99810 Abnormal glucose complicating pregnancy: Secondary | ICD-10-CM | POA: Diagnosis present

## 2012-11-05 DIAGNOSIS — O98719 Human immunodeficiency virus [HIV] disease complicating pregnancy, unspecified trimester: Secondary | ICD-10-CM | POA: Diagnosis present

## 2012-11-05 LAB — PROTEIN / CREATININE RATIO, URINE
Creatinine, Urine: 189.73 mg/dL
Protein Creatinine Ratio: 0.51 — ABNORMAL HIGH (ref 0.00–0.15)

## 2012-11-05 LAB — URINALYSIS, ROUTINE W REFLEX MICROSCOPIC
Bilirubin Urine: NEGATIVE
Hgb urine dipstick: NEGATIVE
Specific Gravity, Urine: 1.025 (ref 1.005–1.030)
pH: 6.5 (ref 5.0–8.0)

## 2012-11-05 LAB — COMPREHENSIVE METABOLIC PANEL
ALT: 12 U/L (ref 0–35)
AST: 15 U/L (ref 0–37)
Albumin: 2.6 g/dL — ABNORMAL LOW (ref 3.5–5.2)
Calcium: 8.7 mg/dL (ref 8.4–10.5)
Sodium: 131 mEq/L — ABNORMAL LOW (ref 135–145)
Total Protein: 6.8 g/dL (ref 6.0–8.3)

## 2012-11-05 LAB — URINE MICROSCOPIC-ADD ON

## 2012-11-05 LAB — CBC
MCV: 87.7 fL (ref 78.0–100.0)
Platelets: 266 10*3/uL (ref 150–400)
RBC: 3.91 MIL/uL (ref 3.87–5.11)
WBC: 5.7 10*3/uL (ref 4.0–10.5)

## 2012-11-05 MED ORDER — ZOLPIDEM TARTRATE 5 MG PO TABS
5.0000 mg | ORAL_TABLET | Freq: Every evening | ORAL | Status: DC | PRN
  Administered 2012-11-09: 5 mg via ORAL
  Filled 2012-11-05: qty 1

## 2012-11-05 MED ORDER — DOCUSATE SODIUM 100 MG PO CAPS
100.0000 mg | ORAL_CAPSULE | Freq: Every day | ORAL | Status: DC
  Administered 2012-11-06 – 2012-11-10 (×5): 100 mg via ORAL
  Filled 2012-11-05 (×7): qty 1

## 2012-11-05 MED ORDER — ELVITEG-COBIC-EMTRICIT-TENOFDF 150-150-200-300 MG PO TABS
1.0000 | ORAL_TABLET | Freq: Once | ORAL | Status: AC
  Administered 2012-11-05: 1 via ORAL
  Filled 2012-11-05: qty 1

## 2012-11-05 MED ORDER — CALCIUM CARBONATE ANTACID 500 MG PO CHEW
2.0000 | CHEWABLE_TABLET | ORAL | Status: DC | PRN
  Filled 2012-11-05: qty 2

## 2012-11-05 MED ORDER — PANTOPRAZOLE SODIUM 20 MG PO TBEC
20.0000 mg | DELAYED_RELEASE_TABLET | Freq: Every day | ORAL | Status: DC
  Administered 2012-11-06 – 2012-11-10 (×5): 20 mg via ORAL
  Filled 2012-11-05 (×7): qty 1

## 2012-11-05 MED ORDER — ELVITEG-COBIC-EMTRICIT-TENOFDF 150-150-200-300 MG PO TABS
1.0000 | ORAL_TABLET | Freq: Every day | ORAL | Status: DC
  Administered 2012-11-06 – 2012-11-08 (×3): 1 via ORAL
  Filled 2012-11-05 (×5): qty 1

## 2012-11-05 MED ORDER — PRENATAL MULTIVITAMIN CH
1.0000 | ORAL_TABLET | Freq: Every day | ORAL | Status: DC
  Administered 2012-11-06 – 2012-11-10 (×5): 1 via ORAL
  Filled 2012-11-05 (×7): qty 1

## 2012-11-05 MED ORDER — ACETAMINOPHEN 325 MG PO TABS
650.0000 mg | ORAL_TABLET | ORAL | Status: DC | PRN
  Administered 2012-11-09 – 2012-11-10 (×3): 650 mg via ORAL
  Filled 2012-11-05: qty 2
  Filled 2012-11-05: qty 1
  Filled 2012-11-05: qty 2

## 2012-11-05 MED ORDER — BETAMETHASONE SOD PHOS & ACET 6 (3-3) MG/ML IJ SUSP
12.0000 mg | INTRAMUSCULAR | Status: AC
  Administered 2012-11-05 – 2012-11-06 (×2): 12 mg via INTRAMUSCULAR
  Filled 2012-11-05 (×2): qty 2

## 2012-11-05 MED ORDER — ELVITEG-COBIC-EMTRICIT-TENOFDF 150-150-200-300 MG PO TABS
1.0000 | ORAL_TABLET | Freq: Every day | ORAL | Status: DC
Start: 2012-11-05 — End: 2012-11-05
  Filled 2012-11-05: qty 1

## 2012-11-05 NOTE — H&P (Signed)
Agree with above note.  Pt. Reviewed with MFM prior to bein seen with elevated BP, proteinuria.  Will admit for inpatient work-up and completion of BMZ.

## 2012-11-05 NOTE — H&P (Signed)
Diane Lowery is a 32 y.o. female presenting for observation at 26.2 weeks for gestational hypertension and fetal growth restriction.  Pt seen in ultrasound for growth [place] due to maternal history HIV.  Fetal growth decreased compared to 19 wk ultrasound.  At 19 wks fetal growth measured 38%, today 13% overall with AC, FL, and HC trending downward and < 3%.  AFI at 3% today as well.  No absent or reverse end diastolic flow.    Denies headache or epigastric pain.    Maternal Medical History:  Fetal activity: Perceived fetal activity is normal.    Prenatal complications: HIV and IUGR.     OB History   Grav Para Term Preterm Abortions TAB SAB Ect Mult Living   2 1 1       1      Past Medical History  Diagnosis Date  . HIV infection   . History of chicken pox   . Migraines     Excedrin migraine or immitrex  . Gestational diabetes     diet controlled   Past Surgical History  Procedure Laterality Date  . No past surgeries     Family History: family history includes Hypertension in her mother and sister; Stroke in her maternal aunt. There is no history of Cancer, Diabetes, Early death, or Hyperlipidemia. Social History:  reports that she has never smoked. She has never used smokeless tobacco. She reports that she does not drink alcohol or use illicit drugs.   Prenatal Transfer Tool  Maternal Diabetes: No Genetic Screening: Declined Maternal Ultrasounds/Referrals: Abnormal:  Findings:   IUGR Fetal Ultrasounds or other Referrals:  Other:  Maternal Substance Abuse:  No Significant Maternal Medications:  Meds include: Other:  Significant Maternal Lab Results:  Lab values include: HIV positive Other Comments:  Gestational hypertension; fetal growth restriction.    Review of Systems  Eyes: Negative.   Gastrointestinal: Negative for abdominal pain.  Neurological: Negative for headaches.  All other systems reviewed and are negative.      Blood pressure 141/93, pulse 65, resp. rate  16, height 5\' 4"  (1.626 m), weight 78.291 kg (172 lb 9.6 oz), last menstrual period 05/14/2012.   Fetal Exam Fetal Monitor Review: Baseline rate: 130's.  Variability: minimal (<5 bpm).   Pattern: accelerations present.    Fetal State Assessment: Category I - tracings are normal.     Physical Exam  Constitutional: She is oriented to person, place, and time. She appears well-developed and well-nourished. No distress.  HENT:  Head: Normocephalic.  Eyes: Pupils are equal, round, and reactive to light.  Neck: Normal range of motion. Neck supple.  Cardiovascular: Normal rate and regular rhythm.   Respiratory: Effort normal and breath sounds normal.  GI: Soft. There is no tenderness.  Genitourinary: No bleeding around the vagina.  Musculoskeletal: Normal range of motion. She exhibits edema (trace bilat).  Trace pedal edema  Neurological: She is alert and oriented to person, place, and time. She has normal reflexes. She displays normal reflexes.  Skin: Skin is warm and dry.    Prenatal labs: ABO, Rh: B/POS/-- (05/01 1113) Antibody: NEG (05/01 1113) Rubella: 2.49 (05/01 1113) RPR: NON REAC (05/01 1113)  HBsAg: NEGATIVE (05/01 1113)  HIV:    GBS:     Assessment/Plan: 32 yo G2P1001 at 26.2 wks IUP Gestational Hypertension Growth Restriction +HIV  Plan: Admit to Antenatal for observation Fetal monitoring BMZ  Collect 24 hr urine   [facility] 11/05/2012, 1:56 PM

## 2012-11-05 NOTE — MAU Note (Signed)
Patient sent to MAU from ultrasound for NST due to fetal growth, size less than dates. Patient denies any pain, bleeding or leaking. Reports fetal movement.

## 2012-11-06 LAB — PROTEIN, URINE, 24 HOUR
Collection Interval-UPROT: 24 hours
Urine Total Volume-UPROT: 3350 mL

## 2012-11-06 LAB — CREATININE CLEARANCE, URINE, 24 HOUR
Creatinine, 24H Ur: 1822 mg/d — ABNORMAL HIGH (ref 700–1800)
Creatinine, Urine: 54.39 mg/dL
Creatinine: 0.75 mg/dL (ref 0.50–1.10)

## 2012-11-06 MED ORDER — LACTATED RINGERS IV BOLUS (SEPSIS)
500.0000 mL | Freq: Once | INTRAVENOUS | Status: AC
  Administered 2012-11-06: 500 mL via INTRAVENOUS

## 2012-11-06 MED ORDER — LACTATED RINGERS IV SOLN
INTRAVENOUS | Status: DC
  Administered 2012-11-06: 07:00:00 via INTRAVENOUS
  Administered 2012-11-06: 100 mL/h via INTRAVENOUS
  Administered 2012-11-06 – 2012-11-07 (×3): via INTRAVENOUS
  Administered 2012-11-07: 100 mL/h via INTRAVENOUS
  Administered 2012-11-08 – 2012-11-10 (×5): via INTRAVENOUS

## 2012-11-06 NOTE — Progress Notes (Signed)
Called to evaluate 3 spontaneous prolonged variable decels to the 60's over the past hour.  Pt asleep in left tilt position.  Decels resolved spontaneously without intervention.  IV acces/bolus ordered.

## 2012-11-06 NOTE — Progress Notes (Signed)
FACULTY PRACTICE ANTEPARTUM(COMPREHENSIVE) NOTE  Diane Lowery is a 32 y.o. G2P1001 at [redacted]w[redacted]d who is admitted for IUGR, GHTN and monitoring.   Fetal presentation is cephalic. Length of Stay:  1  Days  Subjective: Pt doing well overnight. No complaints. Pt with several prolonged decel to 60 while pt is sleeping. rec'd bolus Patient reports the fetal movement as active. Patient reports uterine contraction  activity as none. Patient reports  vaginal bleeding as none. Patient describes fluid per vagina as None. No vision change, no RUQ pain, no change in edema Vitals:  Blood pressure 138/80, pulse 74, temperature 98.4 F (36.9 C), temperature source Oral, resp. rate 18, height 5\' 4"  (1.626 m), weight 81.194 kg (179 lb), last menstrual period 05/14/2012. Filed Vitals:   11/05/12 2020 11/05/12 2204 11/06/12 0211 11/06/12 0609  BP: 122/65 146/85 142/86 138/80  Pulse: 70 69 83 74  Temp:    98.4 F (36.9 C)  TempSrc:    Oral  Resp: 18 18 16 18   Height:      Weight:         Physical Examination:  General appearance - alert, well appearing, and in no distress, oriented to person, place, and time and normal appearing weight Heart - normal rate and regular rhythm Abdomen - soft, nontender, nondistended Fundal Height:  size less than dates Cervical Exam: Not evaluated. and found to be not evaluated Extremities: extremities normal, atraumatic, no cyanosis or edema and Homans sign is negative, no sign of DVT with DTRs 2+ bilaterally Membranes:intact  Fetal Monitoring:  Baseline: 140 bpm, Variability: Fair (1-6 bpm), Accelerations: Absent and Decelerations: Variable: moderate with decel to 60s for 1-61min with quick recovery  Labs:  Results for orders placed during the hospital encounter of 11/05/12 (from the past 24 hour(s))  CBC   Collection Time    11/05/12 11:58 AM      Result Value Range   WBC 5.7  4.0 - 10.5 K/uL   RBC 3.91  3.87 - 5.11 MIL/uL   Hemoglobin 11.7 (*) 12.0 - 15.0  g/dL   HCT 16.1 (*) 09.6 - 04.5 %   MCV 87.7  78.0 - 100.0 fL   MCH 29.9  26.0 - 34.0 pg   MCHC 34.1  30.0 - 36.0 g/dL   RDW 40.9  81.1 - 91.4 %   Platelets 266  150 - 400 K/uL  COMPREHENSIVE METABOLIC PANEL   Collection Time    11/05/12 11:58 AM      Result Value Range   Sodium 131 (*) 135 - 145 mEq/L   Potassium 4.1  3.5 - 5.1 mEq/L   Chloride 99  96 - 112 mEq/L   CO2 22  19 - 32 mEq/L   Glucose, Bld 82  70 - 99 mg/dL   BUN 10  6 - 23 mg/dL   Creatinine, Ser 7.82  0.50 - 1.10 mg/dL   Calcium 8.7  8.4 - 95.6 mg/dL   Total Protein 6.8  6.0 - 8.3 g/dL   Albumin 2.6 (*) 3.5 - 5.2 g/dL   AST 15  0 - 37 U/L   ALT 12  0 - 35 U/L   Alkaline Phosphatase 102  39 - 117 U/L   Total Bilirubin 0.1 (*) 0.3 - 1.2 mg/dL   GFR calc non Af Amer >90  >90 mL/min   GFR calc Af Amer >90  >90 mL/min  PROTEIN / CREATININE RATIO, URINE   Collection Time    11/05/12  1:05 PM  Result Value Range   Creatinine, Urine 189.73     Total Protein, Urine 96.6     PROTEIN CREATININE RATIO 0.51 (*) 0.00 - 0.15  URINALYSIS, ROUTINE W REFLEX MICROSCOPIC   Collection Time    11/05/12  1:05 PM      Result Value Range   Color, Urine YELLOW  YELLOW   APPearance CLEAR  CLEAR   Specific Gravity, Urine 1.025  1.005 - 1.030   pH 6.5  5.0 - 8.0   Glucose, UA NEGATIVE  NEGATIVE mg/dL   Hgb urine dipstick NEGATIVE  NEGATIVE   Bilirubin Urine NEGATIVE  NEGATIVE   Ketones, ur NEGATIVE  NEGATIVE mg/dL   Protein, ur 119 (*) NEGATIVE mg/dL   Urobilinogen, UA 0.2  0.0 - 1.0 mg/dL   Nitrite NEGATIVE  NEGATIVE   Leukocytes, UA NEGATIVE  NEGATIVE  URINE MICROSCOPIC-ADD ON   Collection Time    11/05/12  1:05 PM      Result Value Range   Squamous Epithelial / LPF FEW (*) RARE   WBC, UA 0-2  <3 WBC/hpf    Imaging Studies:    US8/14 SIUP, IUGR, normal AFI, normal Cervical Length  Medications:  Scheduled . betamethasone acetate-betamethasone sodium phosphate  12 mg Intramuscular Q24H  . docusate sodium  100 mg  Oral Daily  . elvitegravir-cobicistat-emtricitabine-tenofovir  1 tablet Oral Q breakfast  . pantoprazole  20 mg Oral Daily  . prenatal multivitamin  1 tablet Oral Q1200   I have reviewed the patient's current medications.  ASSESSMENT: Diane Lowery is a 32 y.o. G2P1001 at [redacted]w[redacted]d who is admitted for IUGR, GHTN and Fetal monitoring.  HIV +  Patient Active Problem List   Diagnosis Date Noted  . Severe dysplasia of cervix (CIN III) 07/23/2012  . Supervision of high risk pregnancy in first trimester 07/23/2012  . Maternal HIV positive complicating pregnancy, antepartum 07/23/2012  . HIV DISEASE 10/28/2006  . HSV 10/28/2006  . 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 MIGRAINE, UNSPEC., W/O INTRACTABLE MIGRAINE 05/22/2006    PLAN: Antenatal for observation  Fetal monitoring - continuous monitoring, Variable decels while on monitor. BMZ, 2nd dose at 1501 Collect 24 hr urine initial WNL, currently repeating BPs appropriate.  Lillyahna Hemberger RYAN 11/06/2012,7:34 AM

## 2012-11-07 ENCOUNTER — Inpatient Hospital Stay (HOSPITAL_COMMUNITY): Payer: Medicaid Other

## 2012-11-07 DIAGNOSIS — O139 Gestational [pregnancy-induced] hypertension without significant proteinuria, unspecified trimester: Secondary | ICD-10-CM

## 2012-11-07 MED ORDER — LABETALOL HCL 5 MG/ML IV SOLN: 20.0000 mg | Freq: Once | INTRAVENOUS | Status: DC

## 2012-11-07 MED ORDER — LABETALOL HCL 5 MG/ML IV SOLN
10.0000 mg | INTRAVENOUS | Status: DC | PRN
  Administered 2012-11-07 – 2012-11-08 (×2): 10 mg via INTRAVENOUS
  Filled 2012-11-07: qty 4

## 2012-11-07 MED ORDER — LABETALOL HCL 5 MG/ML IV SOLN
INTRAVENOUS | Status: AC
  Filled 2012-11-07: qty 4

## 2012-11-07 NOTE — Progress Notes (Signed)
FM indicating variables with pt. Sitting in semifowler's position.  Instructed pt. To rest in bed tilted to left side with pillow to back.  Monitoring to see if position change decreases variables.

## 2012-11-07 NOTE — Progress Notes (Signed)
Reviewed result of doppler and BPP, 6/8 (-2 breathing) dopplers elevated, no reverse or absent flow. Will repeat BPP tomorrow.  Adam Phenix, MD 11/07/2012 8:23 PM

## 2012-11-07 NOTE — Progress Notes (Signed)
Patient ID: Diane Lowery, female   DOB: Apr 08, 1980, 32 y.o.   MRN: [id number] Occasional variable decels. Patient denies contractions, feels fetal movement  Filed Vitals:   11/07/12 0020 11/07/12 0854 11/07/12 0856 11/07/12 1138  BP: 144/82 157/97  141/80  Pulse: 78 53  63  Temp: 98.2 F (36.8 C) 98.6 F (37 C) 98.6 F (37 C) 98 F (36.7 C)  TempSrc: Oral  Oral Oral  Resp: 18  20 20   Height:      Weight:       NAD, pleasant  Abdomen gravid and not tender  FHR 150, occasional variable deceleration  Imp IUP 26.4 weeks, suspicious for feta growth restriction , with variable decel on monitor. Will do BPP and repeat dopplers  Adam Phenix, MD 11/07/2012 5:52 PM

## 2012-11-07 NOTE — Progress Notes (Signed)
Patient ID: Diane Lowery, female   DOB: 03-27-1980, 32 y.o.   MRN: [id number] ACULTY PRACTICE ANTEPARTUM COMPREHENSIVE PROGRESS NOTE  Diane Lowery is a 32 y.o. G2P1001 at [redacted]w[redacted]d  who is admitted for elevated blood pressure evaluate for preeclampsia.  Now with diagnosed preeclampsia .   Fetal presentation is cephalic Length of Stay:  2  Days  Subjective: Pt with no complaints.  Patient reports good fetal movement.  She reports no uterine contractions, no bleeding and no loss of fluid per vagina.  Vitals:  Blood pressure 144/82, pulse 78, temperature 98.2 F (36.8 C), temperature source Oral, resp. rate 18, height 5\' 4"  (1.626 m), weight 179 lb (81.194 kg), last menstrual period 05/14/2012. Physical Examination: General appearance - alert, well appearing, and in no distress Abdomen - soft, nontender, nondistended, no masses or organomegaly gravid  Fetal Monitoring:  Baseline: 140's bpm Appropriate for gestation la age  Labs:  24 hour urnie protein 1005grams  Imaging Studies:    EFW 13th%ile UA S/D ration %94ile   Medications:  Scheduled . docusate sodium  100 mg Oral Daily  . elvitegravir-cobicistat-emtricitabine-tenofovir  1 tablet Oral Q breakfast  . pantoprazole  20 mg Oral Daily  . prenatal multivitamin  1 tablet Oral Q1200   I have reviewed the patient's current medications.  ASSESSMENT: Patient Active Problem List   Diagnosis Date Noted  . Severe dysplasia of cervix (CIN III) 07/23/2012  . Supervision of high risk pregnancy in first trimester 07/23/2012  . Maternal HIV positive complicating pregnancy, antepartum 07/23/2012  . HIV DISEASE 10/28/2006  . HSV 10/28/2006  . 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 MIGRAINE, UNSPEC., W/O INTRACTABLE MIGRAINE 05/22/2006    PLAN: Keep inpt to determine BP trend.   Pt may be a candicdate for oupt management of preeclampsia    Continue routine antenatal care.   HARRAWAY-SMITH, Havard Radigan 11/07/2012,7:42 AM

## 2012-11-07 NOTE — Progress Notes (Signed)
Called Dr. Debroah Loop to report variables on pt. Strip.  Variables have increased 1514 today.  Dr. Debroah Loop to come talk with pt.

## 2012-11-07 NOTE — Progress Notes (Signed)
Dr. Debroah Loop in to talk with pt. About POC.  Pt. To have and U/S and doppler this afternoon.

## 2012-11-08 ENCOUNTER — Inpatient Hospital Stay (HOSPITAL_COMMUNITY): Payer: Medicaid Other

## 2012-11-08 LAB — CBC
HCT: 33.1 % — ABNORMAL LOW (ref 36.0–46.0)
MCH: 30.3 pg (ref 26.0–34.0)
MCV: 87.3 fL (ref 78.0–100.0)
Platelets: 304 10*3/uL (ref 150–400)
RDW: 12.9 % (ref 11.5–15.5)
WBC: 17.1 10*3/uL — ABNORMAL HIGH (ref 4.0–10.5)

## 2012-11-08 LAB — COMPREHENSIVE METABOLIC PANEL
Albumin: 2.3 g/dL — ABNORMAL LOW (ref 3.5–5.2)
BUN: 12 mg/dL (ref 6–23)
CO2: 22 mEq/L (ref 19–32)
Calcium: 8.4 mg/dL (ref 8.4–10.5)
Chloride: 100 mEq/L (ref 96–112)
Creatinine, Ser: 0.75 mg/dL (ref 0.50–1.10)
GFR calc non Af Amer: >90 mL/min (ref >90)
Total Bilirubin: 0.1 mg/dL — ABNORMAL LOW (ref 0.3–1.2)

## 2012-11-08 MED ORDER — LABETALOL HCL 5 MG/ML IV SOLN
20.0000 mg | INTRAVENOUS | Status: DC | PRN
  Administered 2012-11-08: 10 mg via INTRAVENOUS
  Administered 2012-11-09: 20 mg via INTRAVENOUS
  Filled 2012-11-08: qty 4

## 2012-11-08 MED ORDER — HYDRALAZINE HCL 20 MG/ML IJ SOLN
INTRAMUSCULAR | Status: AC
  Administered 2012-11-08: 10 mg
  Filled 2012-11-08: qty 1

## 2012-11-08 MED ORDER — LABETALOL HCL 200 MG PO TABS
200.0000 mg | ORAL_TABLET | Freq: Two times a day (BID) | ORAL | Status: DC
  Administered 2012-11-08 – 2012-11-10 (×6): 200 mg via ORAL
  Filled 2012-11-08 (×7): qty 1

## 2012-11-08 NOTE — Progress Notes (Signed)
Patient ID: Diane Lowery, female   DOB: 08/02/1980, 32 y.o.   MRN: [id number] FACULTY PRACTICE ANTEPARTUM(COMPREHENSIVE) NOTE  Diane Lowery is a 32 y.o. G2P1001 at [redacted]w[redacted]d by best clinical estimate who is admitted for IUGR and pre-eclampsia.   Fetal presentation is cephalic. Length of Stay:  3  Days  Subjective:no contractions, short of breath  Patient reports the fetal movement as active. Patient reports uterine contraction  activity as none. Patient reports  vaginal bleeding as none. Patient describes fluid per vagina as None.  Vitals:  Blood pressure 151/83, pulse 71, temperature 98.4 F (36.9 C), temperature source Oral, resp. rate 20, height 5\' 4"  (1.626 m), weight 179 lb (81.194 kg), last menstrual period 05/14/2012. Physical Examination:  General appearance - alert, well appearing, and in no distress, anxious and in mild to moderate distress Heart - normal rate and regular rhythm Abdomen - soft, nontender, nondistended Fundal Height:  size equals dates Cervical Exam: Not evaluated.  Extremities: extremities normal, atraumatic, no cyanosis or edema Membranes:intact  Fetal Monitoring:  Baseline: 150, little variability bpm  Labs:  No results found for this or any previous visit (from the past 24 hour(s)).  Imaging Studies:      Currently EPIC will not allow sonographic studies to automatically populate into notes.  In the meantime, copy and paste results into note or free text.  Medications:  Scheduled . docusate sodium  100 mg Oral Daily  . elvitegravir-cobicistat-emtricitabine-tenofovir  1 tablet Oral Q breakfast  . labetalol  200 mg Oral BID  . labetalol      . pantoprazole  20 mg Oral Daily  . prenatal multivitamin  1 tablet Oral Q1200   I have reviewed the patient's current medications.  ASSESSMENT: Patient Active Problem List   Diagnosis Date Noted  . Severe dysplasia of cervix (CIN III) 07/23/2012  . Supervision of high risk pregnancy in first  trimester 07/23/2012  . Maternal HIV positive complicating pregnancy, antepartum 07/23/2012  . HIV DISEASE 10/28/2006  . HSV 10/28/2006  . 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 MIGRAINE, UNSPEC., W/O INTRACTABLE MIGRAINE 05/22/2006    PLAN: Worsening BP, will treat with labetalol and hydralazine and consult MFM. BPP today  ARNOLD,JAMES 11/08/2012,8:36 AM

## 2012-11-08 NOTE — Progress Notes (Signed)
Pt. Reports feeling better.  Has mild HA and neck pain 3/10.  Labetalol 200mg . P.o. Has been given for prevention of elevated bp.  Positioned on left side with HOB up 30 degrees.  .   Current Bp 135/71  HR 85.  No s/s respiratory distress.  S.O. At bedside.

## 2012-11-08 NOTE — Progress Notes (Signed)
Received pt with EFM belts off.  Pt complaining that belts had been too tight and she wants them off for now. Belt impressions visible no open areas no bruising .  Reports fetal movement and BBP 8/8 today.  Pt's request for a break from monitor request respected.

## 2012-11-08 NOTE — Progress Notes (Addendum)
Dr. Debroah Loop notified of Bp 182/ 110.  Order given to administer labetalol 10 mg. IV x 1.

## 2012-11-08 NOTE — Progress Notes (Addendum)
Labetalol 10 mg. Given x 1 IV for bp, 184/119.   Pt. C/o feeling SOB and heart palpatations.  FHR 150 with minimal variability.

## 2012-11-08 NOTE — Progress Notes (Signed)
Pt. Had BPP today.  Results pending.  Blood pressures since this morning have been within normal limits.  Pt. Denies c/o HA pain or neck pain.  FHR 145 to 150 with occasional variable noted.  Pt. Resting in bed,. S.O. At [facility].

## 2012-11-09 ENCOUNTER — Inpatient Hospital Stay (HOSPITAL_COMMUNITY): Payer: Medicaid Other

## 2012-11-09 MED ORDER — ELVITEG-COBIC-EMTRICIT-TENOFDF 150-150-200-300 MG PO TABS
1.0000 | ORAL_TABLET | Freq: Every day | ORAL | Status: DC
  Administered 2012-11-09 – 2012-11-10 (×2): 1 via ORAL
  Filled 2012-11-09 (×2): qty 1

## 2012-11-09 NOTE — Consult Note (Signed)
MFM Note  Diane Lowery is a 32 year old G2P1 AA female at 26+[redacted] weeks gestation who was admitted on 08/14 for gestational hypertension and fetal growth restriction. Her prenatal course had been uneventful until about a week prior to admission when her BPs noted to be mildly elevated. A routine growth [place] last Thursday revealed the EFW to be at the 13 th %tile with all measurements < 3rd %tile. The anatomy and AFV appeared to be normal. UA dopplers were in the high normal range. Her blood pressures initially were ~ 130s-150s/70s-90s. She received a course of BMZ.  Sunday morning, she had an exacerbation of her BPs, 182/110 and 184/119. She received a few doses of IV labetalol and hydralazine which brought her BPs down nicely. At that point she was started on a scheduled dose of Labetalol 200 mg every 12 hours.  Her blood work was initially normal but the 24 hour urine returned with ~ 1 gram of protein. Her most recent LFTs were  slightly elevated (41/40).   Daily UA dopplers have shown a progression in the wave forms with the initial S/D in the high normal range followed by elevated dopplers and then absent diastolic flow. Today, most tracings had absent diastolic flow with a couple of tracings showing reverse flow. BPPs have been 6/8 or 8/8.  OB history: 2008; SVD at term; female; 87+2; uneventful except for diet controlled gestational diabetes. Medical history: HIV since 2007 on meds and followed by ID; remote history of migraines; HSV; HPV  Assessment: 1) SIUP at 26+6 weeks 2) Preeclampsia with severe features (BP); BPs currently controlled with Labetalol 3) Fetal growth restriction with abnormal UA dopplers 4) HIV on medication 5) HSV/HPV  Recommendations: 1) Continue daily fetal assessments with UA dopplers and BPPs 2) Continue daily maternal assessments with BPs and monitoring for other severe features; serial HELLP labs 3) Give magnesium sulfate if delivery is imminent for seizure px and  neuro px 4) Titrate labetalol to keep BPs < 160/105 5) Growth [place] ~ 08/28 6) Deliver by 34 weeks or with maternal or fetal deterioration ie. severe headache, change in vision, pulmonary edema, renal failure, abruption, eclampsia, DIC, uncontrolled hypertension, HELLP or nonreassuring fetal testing and tracings.   Please call with questions or concerns.  (Face-to-face consultation with patient: 30 min)

## 2012-11-09 NOTE — Progress Notes (Signed)
Pt. In US at this time.

## 2012-11-09 NOTE — Progress Notes (Signed)
Patient ID: Diane Lowery, female   DOB: December 05, 1980, 32 y.o.   MRN: [id number]  FACULTY PRACTICE ANTEPARTUM(COMPREHENSIVE) NOTE  Diane Lowery is a 32 y.o. G2P1001 at [redacted]w[redacted]d who is admitted for IUGR and pre-eclampsia.   Fetal presentation is cephalic. Length of Stay:  3  Days  Subjective:no contractions. No SOB. Worried about the baby.   Patient reports the fetal movement as active. Patient reports uterine contraction  activity as none. Patient reports  vaginal bleeding as none. Patient describes fluid per vagina as None.  Vitals:   Filed Vitals:   11/09/12 1253 11/09/12 1254 11/09/12 1400 11/09/12 1500  BP:  141/92    Pulse:  66    Temp: 98.3 F (36.8 C)     TempSrc: Oral     Resp: 18  18 18   Height:      Weight:         Physical Examination:  General appearance - alert, well appearing, and in no distress, anxious and in mild to moderate distress Heart - normal rate and regular rhythm Abdomen - soft, nontender, nondistended Fundal Height:  size equals dates Cervical Exam: Not evaluated.  Extremities: extremities normal, atraumatic, no cyanosis or edema Membranes:intact  Fetal Monitoring:  Baseline: 145, minimal variability bpm  Labs:  No results found for this or any previous visit (from the past 24 hour(s)).  Imaging Studies:    8/17- BPP 8/8. Absent dopplers with occasional reverse 8/18- BPP 8/8, absent dopplers with occasional reverse flow  Medications:  Scheduled . docusate sodium  100 mg Oral Daily  . elvitegravir-cobicistat-emtricitabine-tenofovir  1 tablet Oral Q breakfast  . labetalol  200 mg Oral BID  . labetalol      . pantoprazole  20 mg Oral Daily  . prenatal multivitamin  1 tablet Oral Q1200   I have reviewed the patient's current medications.  ASSESSMENT: Patient Active Problem List   Diagnosis Date Noted  . Severe dysplasia of cervix (CIN III) 07/23/2012  . Supervision of high risk pregnancy in first trimester 07/23/2012  . Maternal  HIV positive complicating pregnancy, antepartum 07/23/2012  . HIV DISEASE 10/28/2006  . HSV 10/28/2006  . 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 MIGRAINE, UNSPEC., W/O INTRACTABLE MIGRAINE 05/22/2006    PLAN:  1) elevated BP - bp today 130s-140s/80-90s - controlled on labetalol 200mg  BID - cont to monitor  2)  FWB - nonreactive tracing with minimal variability - MFM following closely. BPP today was 8/8 with dopplers showing absent flow with short periods of reverse - recommendation currently to cont to monitor with daily BPP and dopplers - Neonatology consulted to come talk to the pt  3) HIV - viral load still pending - on home antiretroviral medication    Rulon Abide, MD OB fellow

## 2012-11-09 NOTE — Progress Notes (Signed)
Ur chart review completed.  

## 2012-11-09 NOTE — Progress Notes (Signed)
Neonatology Consult Note:  At the request of the patients obstetrician Dr. Shawnie Pons I met with Diane Lowery who is at 26 6  wks currently with pregnancy complicated by gestational hypertension / pre-eclampsia controlled on labetalol 200mg  BID and fetal growth restriction.  Also with + HIV with viral load pending  - on home antiretroviral medication.  Gestational diabetes - diet controlled. s/p BMZ.  8/17- BPP 8/8. Absent dopplers with occasional reverse,  8/18- BPP 8/8, absent dopplers with occasional reverse flow. We discussed morbidity/mortality at this gestional age, delivery room resuscitation, including intubation and surfactant in DR.  Discussed mechanical ventilation and risk for chronic lung disease, risk for IVH with potential for motor / cognitive deficits, ROP, NEC, sepsis, as well as temperature instability and feeding immaturity.  Discussed NG / OG feeds - of note will be unable to feed MBM due to HIV +.   Discussed likely length of stay. Thank you for allowing [place] to participate in her care.  Please call with questions.  John Giovanni, DO  Neonatologist  The total length of face-to-face or floor / unit time for this encounter was 25 minutes.  Counseling and / or coordination of care was greater than fifty percent of the time.

## 2012-11-10 DIAGNOSIS — O1410 Severe pre-eclampsia, unspecified trimester: Secondary | ICD-10-CM | POA: Diagnosis present

## 2012-11-10 DIAGNOSIS — O98519 Other viral diseases complicating pregnancy, unspecified trimester: Secondary | ICD-10-CM

## 2012-11-10 DIAGNOSIS — B20 Human immunodeficiency virus [HIV] disease: Secondary | ICD-10-CM

## 2012-11-10 MED ORDER — PROMETHAZINE HCL 25 MG RE SUPP: 25.0000 mg | Freq: Four times a day (QID) | RECTAL | Status: DC | PRN

## 2012-11-10 MED ORDER — MAGNESIUM SULFATE 40 G IN LACTATED RINGERS - SIMPLE
2.0000 g/h | INTRAVENOUS | Status: DC
  Administered 2012-11-10: 2 g/h via INTRAVENOUS
  Filled 2012-11-10: qty 500

## 2012-11-10 MED ORDER — ONDANSETRON HCL 4 MG/2ML IJ SOLN
4.0000 mg | Freq: Four times a day (QID) | INTRAMUSCULAR | Status: DC | PRN
  Administered 2012-11-10: 4 mg via INTRAVENOUS
  Filled 2012-11-10: qty 2

## 2012-11-10 MED ORDER — PROMETHAZINE HCL 25 MG PO TABS: 25.0000 mg | ORAL_TABLET | Freq: Four times a day (QID) | ORAL | Status: DC | PRN

## 2012-11-10 MED ORDER — MAGNESIUM SULFATE BOLUS VIA INFUSION
6.0000 g | Freq: Once | INTRAVENOUS | Status: AC
  Administered 2012-11-10: 6 g via INTRAVENOUS
  Filled 2012-11-10: qty 500

## 2012-11-10 MED ORDER — PROMETHAZINE HCL 25 MG/ML IJ SOLN: 25.0000 mg | Freq: Four times a day (QID) | INTRAMUSCULAR | Status: DC | PRN

## 2012-11-10 MED ORDER — OXYCODONE-ACETAMINOPHEN 5-325 MG PO TABS
1.0000 | ORAL_TABLET | Freq: Once | ORAL | Status: AC
  Administered 2012-11-10: 1 via ORAL
  Filled 2012-11-10: qty 2

## 2012-11-10 NOTE — Progress Notes (Signed)
Patient ID: Diane Lowery, female   DOB: 10-Dec-1980, 32 y.o.   MRN: [id number]  FACULTY PRACTICE ANTEPARTUM(COMPREHENSIVE) NOTE  Diane Lowery is a 32 y.o. G2P1001 at [redacted]w[redacted]d who is admitted for IUGR and pre-eclampsia.   Fetal presentation is cephalic. Length of Stay:  3  Days  Subjective:no contractions. No SOB. Worried about the baby.   Patient reports the fetal movement as active. Patient reports uterine contraction  activity as none. Patient reports  vaginal bleeding as none. Patient describes fluid per vagina as None.  Vitals:   Filed Vitals:   11/09/12 2116 11/09/12 2126 11/09/12 2136 11/10/12 0013  BP: 147/86 138/87 141/86 138/72  Pulse: 91 84 90 96  Temp:    98.2 F (36.8 C)  TempSrc:    Oral  Resp:    18  Height:      Weight:         Physical Examination:  General appearance - alert, well appearing, and in no distress, anxious and in mild to moderate distress Heart - normal rate and regular rhythm Abdomen - soft, nontender, nondistended Fundal Height:  size equals dates Cervical Exam: Not evaluated.  Extremities: extremities normal, atraumatic, no cyanosis or edema Membranes:intact  Fetal Monitoring:  Baseline: 140, minimal variability, occasional variable decels Toco: no contractions  Labs:  No results found for this or any previous visit (from the past 24 hour(s)).  Imaging Studies:    8/18- BPP 8/8, absent dopplers with occasional reverse flow  Medications:  Scheduled . docusate sodium  100 mg Oral Daily  . elvitegravir-cobicistat-emtricitabine-tenofovir  1 tablet Oral Q breakfast  . labetalol  200 mg Oral BID  . labetalol      . pantoprazole  20 mg Oral Daily  . prenatal multivitamin  1 tablet Oral Q1200   I have reviewed the patient's current medications.  ASSESSMENT: Patient Active Problem List   Diagnosis Date Noted  . Severe dysplasia of cervix (CIN III) 07/23/2012  . Supervision of high risk pregnancy in first trimester 07/23/2012   . Maternal HIV positive complicating pregnancy, antepartum 07/23/2012  . HIV DISEASE 10/28/2006  . HSV 10/28/2006  . 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 MIGRAINE, UNSPEC., W/O INTRACTABLE MIGRAINE 05/22/2006    PLAN: - Continue monitoring BP - Continue labetalol 200mg  BID - Continue daily BPP and UA doppler - Continue antiretroviral - continue current antenatal care - Answered patient questions regarding delivery planning.

## 2012-11-10 NOTE — Progress Notes (Signed)
11/10/12 1200  Clinical Encounter Type  Visited With Patient and family together (firend [place] visiting)  Visit Type Initial;Spiritual support;Social support  Spiritual Encounters  Spiritual Needs Emotional  Stress Factors  Patient Stress Factors Major life changes;Loss of control (older dtr Michaela starting Kindergarten next week)   Diane Lowery was in good spirits on this initial visit, reporting support from family, friends, and church.  She is coping with missing out on the last-minute preparations for her older daughter to start Kindergarten next week, and we named the excitement surrounding that transition and the grief about not getting to walk with her daughter so closely through that process as pt would like.  Provided pastoral presence, reflective listening, and encouragement.  Diane Lowery values prayer and opportunity to share stories and process feelings.  Spiritual Care will continue to follow for support.  [address] [place], [place] [phone]

## 2012-11-10 NOTE — Progress Notes (Signed)
Faculty Practice OB/GYN Attending Note  RN called to inform me that patient is having nausea and vomiting again, desires antiemetic.  Antiemetic ordered.  BP 148/102, no other concerning symptoms. She is currently on magnesium sulfate, no signs of toxicity. Stable FHR tracing with baseline of 135 bpm, minimal variability, no accelerations, occasional brief variable decelerations.  Will check preeclampsia labs now; will follow up results and manage accordingly. Continue close observation.   Jaynie Collins, MD, FACOG Attending Obstetrician & Gynecologist Faculty Practice, [facility] [place]

## 2012-11-11 ENCOUNTER — Ambulatory Visit (HOSPITAL_COMMUNITY): Payer: Medicaid Other

## 2012-11-11 DIAGNOSIS — O365990 Maternal care for other known or suspected poor fetal growth, unspecified trimester, not applicable or unspecified: Secondary | ICD-10-CM | POA: Diagnosis present

## 2012-11-11 LAB — CBC
HCT: 37.1 % (ref 36.0–46.0)
Hemoglobin: 12.9 g/dL (ref 12.0–15.0)
MCH: 30.6 pg (ref 26.0–34.0)
MCHC: 34.8 g/dL (ref 30.0–36.0)

## 2012-11-11 LAB — TYPE AND SCREEN
ABO/RH(D): B POS
Antibody Screen: NEGATIVE

## 2012-11-11 LAB — COMPREHENSIVE METABOLIC PANEL
Alkaline Phosphatase: 117 U/L (ref 39–117)
BUN: 10 mg/dL (ref 6–23)
Calcium: 7.7 mg/dL — ABNORMAL LOW (ref 8.4–10.5)
GFR calc Af Amer: >90 mL/min (ref >90)
GFR calc non Af Amer: >90 mL/min (ref >90)
Glucose, Bld: 80 mg/dL (ref 70–99)
Total Protein: 6.8 g/dL (ref 6.0–8.3)

## 2012-11-11 LAB — LACTATE DEHYDROGENASE: LDH: 285 U/L — ABNORMAL HIGH (ref 94–250)

## 2012-11-11 NOTE — Progress Notes (Signed)
11/11/12 0300  Medical Necessity for Transport Certificate --- IF THIS TRANSPORT IS ROUND TRIP OR SCHEDULED AND REPEATED, A PHYSICIAN MUST COMPLETE THIS FORM  Transport to (Location) [facility]  Reason for Transport Other (Comment) (NICU availability)  Name of Transporting Agency [facility]  Round Trip Transport? No  Q1 Are ALL the following "true" for this patient?              1. Unable to get up from bed without assistance  AND            2. Unable to ambulate AND        3. Unable to sit in a chair, including a wheelchair. No  Q2 Could the patient be transported safely by other means of transportation (I.E., wheelchair van)? No  Reason for transport - patient condition Requires IV maintenance;Other (Comment) (NICU availability)  Q3 Reason based on Facility and/or Services No bed available at original facility  International aid/development worker and Credentials (can be: Physician, RN, NP, PA, SW, CNS) Laurene Footman, RN for Dr Macon Large

## 2012-11-11 NOTE — Progress Notes (Signed)
Report given to Caryl Bis, RN at [facility] L&D.

## 2012-11-11 NOTE — Progress Notes (Addendum)
Faculty Practice OB/GYN Attending Note  Subjective:  Patient with continued nausea and vomiting, also has upper abdominal pain. Stable FHR tracing with baseline of 135 bpm, minimal variability, no accelerations, occasional brief variable decelerations. Labs were checked and remarkable for significantly elevated LFTs.  Given that she is >48 hours after her betamethasone regimen given on 8/14-8/15, there is no indication for further expectant management and she needs to be delivered. No contractions, no LOF or vaginal bleeding. Good FM.   Admitted on 11/05/2012 for Hypertension in pregnancy, preeclampsia, severe, antepartum.   Objective:  Blood pressure 144/96, pulse 75, temperature 98.2 F (36.8 C), temperature source Oral, resp. rate 18, height 5\' 4"  (1.626 m), weight 179 lb (81.194 kg), last menstrual period 05/14/2012, SpO2 100.00%. FHT  135 bpm, minimal variability, no accelerations, occasional brief variable decelerations. Toco: none Gen: NAD Lungs: CTAB Abdomen: Mild upper abdominal and RUQ tenderness to palpation, no rebound , no guarding.  Gravid fundus, soft, nondistended Cervix: Long/thick/closed Ext: 3+ DTRs, 2 beats of clonus, trace edema, no cyanosis, negative Homan's sign  Bedside ultrasound -  Transverse presentation; fetal head to maternal right, back down  Results for orders placed during the hospital encounter of 11/05/12 (from the past 168 hour(s))  CBC   Collection Time    11/05/12 11:58 AM      Result Value Range   WBC 5.7  4.0 - 10.5 K/uL   RBC 3.91  3.87 - 5.11 MIL/uL   Hemoglobin 11.7 (*) 12.0 - 15.0 g/dL   HCT 40.9 (*) 81.1 - 91.4 %   MCV 87.7  78.0 - 100.0 fL   MCH 29.9  26.0 - 34.0 pg   MCHC 34.1  30.0 - 36.0 g/dL   RDW 78.2  95.6 - 21.3 %   Platelets 266  150 - 400 K/uL  COMPREHENSIVE METABOLIC PANEL   Collection Time    11/05/12 11:58 AM      Result Value Range   Sodium 131 (*) 135 - 145 mEq/L   Potassium 4.1  3.5 - 5.1 mEq/L   Chloride 99  96 - 112  mEq/L   CO2 22  19 - 32 mEq/L   Glucose, Bld 82  70 - 99 mg/dL   BUN 10  6 - 23 mg/dL   Creatinine, Ser 0.86  0.50 - 1.10 mg/dL   Calcium 8.7  8.4 - 57.8 mg/dL   Total Protein 6.8  6.0 - 8.3 g/dL   Albumin 2.6 (*) 3.5 - 5.2 g/dL   AST 15  0 - 37 U/L   ALT 12  0 - 35 U/L   Alkaline Phosphatase 102  39 - 117 U/L   Total Bilirubin 0.1 (*) 0.3 - 1.2 mg/dL   GFR calc non Af Amer >90  >90 mL/min   GFR calc Af Amer >90  >90 mL/min  PROTEIN / CREATININE RATIO, URINE   Collection Time    11/05/12  1:05 PM      Result Value Range   Creatinine, Urine 189.73     Total Protein, Urine 96.6     PROTEIN CREATININE RATIO 0.51 (*) 0.00 - 0.15  URINALYSIS, ROUTINE W REFLEX MICROSCOPIC   Collection Time    11/05/12  1:05 PM      Result Value Range   Color, Urine YELLOW  YELLOW   APPearance CLEAR  CLEAR   Specific Gravity, Urine 1.025  1.005 - 1.030   pH 6.5  5.0 - 8.0   Glucose, UA NEGATIVE  NEGATIVE mg/dL   Hgb urine dipstick NEGATIVE  NEGATIVE   Bilirubin Urine NEGATIVE  NEGATIVE   Ketones, ur NEGATIVE  NEGATIVE mg/dL   Protein, ur 409 (*) NEGATIVE mg/dL   Urobilinogen, UA 0.2  0.0 - 1.0 mg/dL   Nitrite NEGATIVE  NEGATIVE   Leukocytes, UA NEGATIVE  NEGATIVE  URINE MICROSCOPIC-ADD ON   Collection Time    11/05/12  1:05 PM      Result Value Range   Squamous Epithelial / LPF FEW (*) RARE   WBC, UA 0-2  <3 WBC/hpf  CREATININE CLEARANCE, URINE, 24 HOUR   Collection Time    11/05/12  5:10 PM      Result Value Range   Urine Total Volume-CRCL 3350     Collection Interval-CRCL 24     Creatinine, Urine 54.39     Creatinine 0.75  0.50 - 1.10 mg/dL   Creatinine, 81X Ur 9147 (*) 700 - 1800 mg/day   Creatinine Clearance 169 (*) 75 - 115 mL/min  PROTEIN, URINE, 24 HOUR   Collection Time    11/05/12  5:10 PM      Result Value Range   Urine Total Volume-UPROT 3350     Collection Interval-UPROT 24     Protein, Urine 30     Protein, 24H Urine 1005 (*) 50 - 100 mg/day  CBC   Collection Time     11/08/12  9:40 AM      Result Value Range   WBC 17.1 (*) 4.0 - 10.5 K/uL   RBC 3.79 (*) 3.87 - 5.11 MIL/uL   Hemoglobin 11.5 (*) 12.0 - 15.0 g/dL   HCT 82.9 (*) 56.2 - 13.0 %   MCV 87.3  78.0 - 100.0 fL   MCH 30.3  26.0 - 34.0 pg   MCHC 34.7  30.0 - 36.0 g/dL   RDW 86.5  78.4 - 69.6 %   Platelets 304  150 - 400 K/uL  COMPREHENSIVE METABOLIC PANEL   Collection Time    11/08/12  9:40 AM      Result Value Range   Sodium 135  135 - 145 mEq/L   Potassium 3.7  3.5 - 5.1 mEq/L   Chloride 100  96 - 112 mEq/L   CO2 22  19 - 32 mEq/L   Glucose, Bld 89  70 - 99 mg/dL   BUN 12  6 - 23 mg/dL   Creatinine, Ser 2.95  0.50 - 1.10 mg/dL   Calcium 8.4  8.4 - 28.4 mg/dL   Total Protein 6.1  6.0 - 8.3 g/dL   Albumin 2.3 (*) 3.5 - 5.2 g/dL   AST 41 (*) 0 - 37 U/L   ALT 40 (*) 0 - 35 U/L   Alkaline Phosphatase 102  39 - 117 U/L   Total Bilirubin 0.1 (*) 0.3 - 1.2 mg/dL   GFR calc non Af Amer >90  >90 mL/min   GFR calc Af Amer >90  >90 mL/min  HIV 1 RNA QUANT-NO REFLEX-BLD   Collection Time    11/08/12  9:50 AM      Result Value Range   HIV 1 RNA Quant <20  <20 copies/mL   HIV1 RNA Quant, Log <1.30  <1.30 log 10  CBC   Collection Time    11/10/12 11:40 PM      Result Value Range   WBC 10.5  4.0 - 10.5 K/uL   RBC 4.22  3.87 - 5.11 MIL/uL   Hemoglobin 12.9  12.0 - 15.0  g/dL   HCT 16.1  09.6 - 04.5 %   MCV 87.9  78.0 - 100.0 fL   MCH 30.6  26.0 - 34.0 pg   MCHC 34.8  30.0 - 36.0 g/dL   RDW 40.9  81.1 - 91.4 %   Platelets 318  150 - 400 K/uL  COMPREHENSIVE METABOLIC PANEL   Collection Time    11/10/12 11:40 PM      Result Value Range   Sodium 133 (*) 135 - 145 mEq/L   Potassium 4.1  3.5 - 5.1 mEq/L   Chloride 95 (*) 96 - 112 mEq/L   CO2 27  19 - 32 mEq/L   Glucose, Bld 80  70 - 99 mg/dL   BUN 10  6 - 23 mg/dL   Creatinine, Ser 7.82  0.50 - 1.10 mg/dL   Calcium 7.7 (*) 8.4 - 10.5 mg/dL   Total Protein 6.8  6.0 - 8.3 g/dL   Albumin 2.6 (*) 3.5 - 5.2 g/dL   AST 43 (*) 0 - 37 U/L    ALT 97 (*) 0 - 35 U/L   Alkaline Phosphatase 117  39 - 117 U/L   Total Bilirubin 0.2 (*) 0.3 - 1.2 mg/dL   GFR calc non Af Amer >90  >90 mL/min   GFR calc Af Amer >90  >90 mL/min  LACTATE DEHYDROGENASE   Collection Time    11/10/12 11:40 PM      Result Value Range   LDH 285 (*) 94 - 250 U/L  MAGNESIUM   Collection Time    11/10/12 11:40 PM      Result Value Range   Magnesium 6.2 (*) 1.5 - 2.5 mg/dL  TYPE AND SCREEN   Collection Time    11/10/12 11:40 PM      Result Value Range   ABO/RH(D) B POS     Antibody Screen NEG     Sample Expiration 11/13/2012      Assessment & Plan:  32 y.o. G2P1001 at [redacted]w[redacted]d admitted for severe preeclampsia with IUGR, umbilical cord dopplers with absent end-diastolic flow and occasional reverse flow; now with twice normal LFTs and worsening severe preeclampsia.   Given that she is >48 hours after her betamethasone regimen given on 8/14-8/15, there is no indication for further expectant management and she needs to be delivered. [facility] NICU is full, patient will be transferred to [facility], accepting attending physician is Dr. Molly Maduro (MFM). Discussed transfer with Dr. Rema Fendt (MFM on call for [facility]) who helped facilitate the transfer. Fetal presentation is transverse, may need cesarean delivery, NPO since 2300 11/10/12.  Patient to be transferred to [facility] L&D; to NICU; report called to the L&D nurse in charge and [facility] will transport patient.  Patient agrees with this plan for transfer.   Jaynie Collins, MD, FACOG Attending Obstetrician & Gynecologist Faculty Practice, [facility] [place]

## 2012-11-11 NOTE — Discharge Summary (Signed)
Antenatal Physician Transfer Summary  Patient ID: Diane Lowery MRN: [id number] DOB/AGE: April 23, 1980 32 y.o.  Admit date: 11/05/2012 Discharge date: 11/11/2012  Admission Diagnoses: IUP at [redacted]w[redacted]d, IUGR, gestational hypertension, concern about preeclampsia, HIV  Discharge Diagnoses: IUD at [redacted]w[redacted]d, severe preeclampsia, IUGR, abnormal umbilical artery dopplers with absent end diastolic flow and occasional reverse flow, HIV  Prenatal Procedures: NST, BPP, umbilical artery dopplers  Intrapartum Procedures: Neonatology, Maternal Fetal Medicine  Significant Diagnostic Studies:  Results for orders placed during the hospital encounter of 11/05/12 (from the past 168 hour(s))  CBC   Collection Time    11/05/12 11:58 AM      Result Value Range   WBC 5.7  4.0 - 10.5 K/uL   RBC 3.91  3.87 - 5.11 MIL/uL   Hemoglobin 11.7 (*) 12.0 - 15.0 g/dL   HCT 40.9 (*) 81.1 - 91.4 %   MCV 87.7  78.0 - 100.0 fL   MCH 29.9  26.0 - 34.0 pg   MCHC 34.1  30.0 - 36.0 g/dL   RDW 78.2  95.6 - 21.3 %   Platelets 266  150 - 400 K/uL  COMPREHENSIVE METABOLIC PANEL   Collection Time    11/05/12 11:58 AM      Result Value Range   Sodium 131 (*) 135 - 145 mEq/L   Potassium 4.1  3.5 - 5.1 mEq/L   Chloride 99  96 - 112 mEq/L   CO2 22  19 - 32 mEq/L   Glucose, Bld 82  70 - 99 mg/dL   BUN 10  6 - 23 mg/dL   Creatinine, Ser 0.86  0.50 - 1.10 mg/dL   Calcium 8.7  8.4 - 57.8 mg/dL   Total Protein 6.8  6.0 - 8.3 g/dL   Albumin 2.6 (*) 3.5 - 5.2 g/dL   AST 15  0 - 37 U/L   ALT 12  0 - 35 U/L   Alkaline Phosphatase 102  39 - 117 U/L   Total Bilirubin 0.1 (*) 0.3 - 1.2 mg/dL   GFR calc non Af Amer >90  >90 mL/min   GFR calc Af Amer >90  >90 mL/min  PROTEIN / CREATININE RATIO, URINE   Collection Time    11/05/12  1:05 PM      Result Value Range   Creatinine, Urine 189.73     Total Protein, Urine 96.6     PROTEIN CREATININE RATIO 0.51 (*) 0.00 - 0.15  URINALYSIS, ROUTINE W REFLEX MICROSCOPIC   Collection Time     11/05/12  1:05 PM      Result Value Range   Color, Urine YELLOW  YELLOW   APPearance CLEAR  CLEAR   Specific Gravity, Urine 1.025  1.005 - 1.030   pH 6.5  5.0 - 8.0   Glucose, UA NEGATIVE  NEGATIVE mg/dL   Hgb urine dipstick NEGATIVE  NEGATIVE   Bilirubin Urine NEGATIVE  NEGATIVE   Ketones, ur NEGATIVE  NEGATIVE mg/dL   Protein, ur 469 (*) NEGATIVE mg/dL   Urobilinogen, UA 0.2  0.0 - 1.0 mg/dL   Nitrite NEGATIVE  NEGATIVE   Leukocytes, UA NEGATIVE  NEGATIVE  URINE MICROSCOPIC-ADD ON   Collection Time    11/05/12  1:05 PM      Result Value Range   Squamous Epithelial / LPF FEW (*) RARE   WBC, UA 0-2  <3 WBC/hpf  CREATININE CLEARANCE, URINE, 24 HOUR   Collection Time    11/05/12  5:10 PM  Result Value Range   Urine Total Volume-CRCL 3350     Collection Interval-CRCL 24     Creatinine, Urine 54.39     Creatinine 0.75  0.50 - 1.10 mg/dL   Creatinine, 16X Ur 0960 (*) 700 - 1800 mg/day   Creatinine Clearance 169 (*) 75 - 115 mL/min  PROTEIN, URINE, 24 HOUR   Collection Time    11/05/12  5:10 PM      Result Value Range   Urine Total Volume-UPROT 3350     Collection Interval-UPROT 24     Protein, Urine 30     Protein, 24H Urine 1005 (*) 50 - 100 mg/day  CBC   Collection Time    11/08/12  9:40 AM      Result Value Range   WBC 17.1 (*) 4.0 - 10.5 K/uL   RBC 3.79 (*) 3.87 - 5.11 MIL/uL   Hemoglobin 11.5 (*) 12.0 - 15.0 g/dL   HCT 45.4 (*) 09.8 - 11.9 %   MCV 87.3  78.0 - 100.0 fL   MCH 30.3  26.0 - 34.0 pg   MCHC 34.7  30.0 - 36.0 g/dL   RDW 14.7  82.9 - 56.2 %   Platelets 304  150 - 400 K/uL  COMPREHENSIVE METABOLIC PANEL   Collection Time    11/08/12  9:40 AM      Result Value Range   Sodium 135  135 - 145 mEq/L   Potassium 3.7  3.5 - 5.1 mEq/L   Chloride 100  96 - 112 mEq/L   CO2 22  19 - 32 mEq/L   Glucose, Bld 89  70 - 99 mg/dL   BUN 12  6 - 23 mg/dL   Creatinine, Ser 1.30  0.50 - 1.10 mg/dL   Calcium 8.4  8.4 - 86.5 mg/dL   Total Protein 6.1  6.0 - 8.3  g/dL   Albumin 2.3 (*) 3.5 - 5.2 g/dL   AST 41 (*) 0 - 37 U/L   ALT 40 (*) 0 - 35 U/L   Alkaline Phosphatase 102  39 - 117 U/L   Total Bilirubin 0.1 (*) 0.3 - 1.2 mg/dL   GFR calc non Af Amer >90  >90 mL/min   GFR calc Af Amer >90  >90 mL/min  HIV 1 RNA QUANT-NO REFLEX-BLD   Collection Time    11/08/12  9:50 AM      Result Value Range   HIV 1 RNA Quant <20  <20 copies/mL   HIV1 RNA Quant, Log <1.30  <1.30 log 10  CBC   Collection Time    11/10/12 11:40 PM      Result Value Range   WBC 10.5  4.0 - 10.5 K/uL   RBC 4.22  3.87 - 5.11 MIL/uL   Hemoglobin 12.9  12.0 - 15.0 g/dL   HCT 78.4  69.6 - 29.5 %   MCV 87.9  78.0 - 100.0 fL   MCH 30.6  26.0 - 34.0 pg   MCHC 34.8  30.0 - 36.0 g/dL   RDW 28.4  13.2 - 44.0 %   Platelets 318  150 - 400 K/uL  COMPREHENSIVE METABOLIC PANEL   Collection Time    11/10/12 11:40 PM      Result Value Range   Sodium 133 (*) 135 - 145 mEq/L   Potassium 4.1  3.5 - 5.1 mEq/L   Chloride 95 (*) 96 - 112 mEq/L   CO2 27  19 - 32 mEq/L   Glucose, Bld 80  70 - 99 mg/dL   BUN 10  6 - 23 mg/dL   Creatinine, Ser 3.08  0.50 - 1.10 mg/dL   Calcium 7.7 (*) 8.4 - 10.5 mg/dL   Total Protein 6.8  6.0 - 8.3 g/dL   Albumin 2.6 (*) 3.5 - 5.2 g/dL   AST 43 (*) 0 - 37 U/L   ALT 97 (*) 0 - 35 U/L   Alkaline Phosphatase 117  39 - 117 U/L   Total Bilirubin 0.2 (*) 0.3 - 1.2 mg/dL   GFR calc non Af Amer >90  >90 mL/min   GFR calc Af Amer >90  >90 mL/min  LACTATE DEHYDROGENASE   Collection Time    11/10/12 11:40 PM      Result Value Range   LDH 285 (*) 94 - 250 U/L  MAGNESIUM   Collection Time    11/10/12 11:40 PM      Result Value Range   Magnesium 6.2 (*) 1.5 - 2.5 mg/dL  TYPE AND SCREEN   Collection Time    11/10/12 11:40 PM      Result Value Range   ABO/RH(D) B POS     Antibody Screen NEG     Sample Expiration 11/13/2012     Treatments: IV hydration; steroids: betamethasone; antihypertensives, magnesium sulfate  Hospital medications Scheduled  Meds: . docusate sodium  100 mg Oral Daily  . elvitegravir-cobicistat-emtricitabine-tenofovir  1 tablet Oral QHS  . labetalol  200 mg Oral BID  . pantoprazole  20 mg Oral Daily  . prenatal multivitamin  1 tablet Oral Q1200   Continuous Infusions: . lactated ringers 100 mL/hr at 11/10/12 1859  . magnesium sulfate 2 g/hr (11/10/12 1120)   PRN Meds:.acetaminophen, calcium carbonate, labetalol, ondansetron (ZOFRAN) IV, promethazine, promethazine, promethazine, zolpidem  Brief Summary of Hospital Course:  This is a 32 y.o.  G2P1001 at [redacted]w[redacted]d admitted for [facility] and IUGR.  She received betamethasone on admission.  She had severe range BP and was started on labetalol. Subsequent evaluation revealed severe preeclampsia with IUGR, umbilical cord dopplers with absent end-diastolic flow and occasional reverse flow despite having BPPs of 8/8. She was followed by Maternal Fetal Care and Neonatology.   On 11/10/12, she started having abdominal pain, nausea and vomiting.  Labs showed more than with twice normal LFTs and worsening severe preeclampsia. Other labs were within normal limits. Of note, patient was already on magnesium sulfate that was started earlier in the day when she had these symptoms and was still receiving 2 g/hour.  Given that she was >48 hours after her betamethasone regimen given on 8/14-8/15, there was no indication for further expectant management and she needed to be delivered. [facility] NICU was full, arrangements were made for the patient to be transferred to [facility]  Accepting attending physician was Dr. Molly Maduro (MFM).  Also discussed transfer with Dr. Rema Fendt (MFM on call for [facility]) who helped facilitate the transfer. Fetal presentation prior to transfer was transverse, patient may need cesarean delivery, NPO since 2300 11/10/12, cervix closed/thick/long. Patient to be transferred to [facility] L&D; report called to the L&D nurse in charge and [facility] will  transport patient. Patient agreed with this plan for transfer.  Transfer Condition: Stable  Transfer Disposition: [facility]   Signed: Jaynie Collins A M.D. 11/11/2012, 3:38 AM

## 2012-11-12 ENCOUNTER — Telehealth: Payer: Self-pay | Admitting: *Deleted

## 2012-11-12 ENCOUNTER — Ambulatory Visit: Payer: Self-pay | Admitting: Internal Medicine

## 2012-11-12 NOTE — Telephone Encounter (Signed)
Tried to call patient to notify her of her missed appointment, but her phone is not currently accepting incoming calls. Andree Coss, RN

## 2012-11-13 ENCOUNTER — Ambulatory Visit (HOSPITAL_COMMUNITY): Payer: Medicaid Other

## 2012-11-19 ENCOUNTER — Ambulatory Visit: Payer: Medicaid Other | Admitting: Obstetrics & Gynecology

## 2012-11-25 ENCOUNTER — Encounter: Payer: Self-pay | Admitting: *Deleted

## 2013-01-05 ENCOUNTER — Ambulatory Visit (INDEPENDENT_AMBULATORY_CARE_PROVIDER_SITE_OTHER): Payer: Medicaid Other | Admitting: Internal Medicine

## 2013-01-05 ENCOUNTER — Ambulatory Visit: Payer: Medicaid Other | Admitting: Internal Medicine

## 2013-01-05 ENCOUNTER — Encounter: Payer: Self-pay | Admitting: Internal Medicine

## 2013-01-05 VITALS — BP 149/111 | HR 109 | Temp 98.4°F | Wt 174.0 lb

## 2013-01-05 DIAGNOSIS — B20 Human immunodeficiency virus [HIV] disease: Secondary | ICD-10-CM

## 2013-01-05 DIAGNOSIS — Z23 Encounter for immunization: Secondary | ICD-10-CM

## 2013-01-05 NOTE — Progress Notes (Signed)
[facility] CLINIC NOTE  RFV: routine, follow up after birth of her child Subjective:    Patient ID: Diane Lowery, female    DOB: [birth year], 32 y.o.   MRN: [id number]  HPI Diane, Lowery, CD 4 count 300/<20 (june 2014), on stribild since beginning of august, previously on truvada/DRVr. Had premature baby girl with IUGR, born at 27wks due to severe pre-ecclampsia, [facility] [place]. Her and her husband are still trying to determine what to do for a living and housing when they are no longer at housing provided by the hospital. They are contemplating moving back to [place] due to her husband having better job opportunity. Diane Lowery has missed her appointment with ob for post csection/delivery visit. No difficulty with csection scar. Healed appropriately  Current Outpatient Prescriptions on File Prior to Visit  Medication Sig Dispense Refill  . elvitegravir-cobicistat-emtricitabine-tenofovir (STRIBILD) 150-150-200-300 MG TABS Take 1 tablet by mouth daily with breakfast.  30 tablet  11  . Lansoprazole (PREVACID PO) Take 1 tablet by mouth daily as needed (indigestion).      . Prenatal Vit-Min-FA-Fish Oil ([employer] PRENATAL GUMMY PO) Take 2 tablets by mouth daily.       No current facility-administered medications on file prior to visit.   Active Ambulatory Problems    Diagnosis Date Noted  . HIV DISEASE 10/28/2006  . HSV 10/28/2006  . 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 MIGRAINE, UNSPEC., W/O INTRACTABLE MIGRAINE 05/22/2006  . Severe dysplasia of cervix (CIN III) 07/23/2012  . Supervision of high risk pregnancy in first trimester 07/23/2012  . Maternal HIV positive complicating pregnancy, antepartum 07/23/2012  . Severe preeclampsia, antepartum 11/10/2012  . IUGR complicating pregnancy, antepartum 11/11/2012  . Intrauterine growth retardation, antepartum 11/11/2012   Resolved Ambulatory Problems    Diagnosis Date Noted  . Abdominal pregnancy with intrauterine pregnancy  06/15/2012   Past Medical History  Diagnosis Date  . HIV infection   . History of chicken pox   . Migraines   . Gestational diabetes        Review of Systems  Constitutional: Negative for fever, chills, diaphoresis, activity change, appetite change, fatigue and unexpected weight change.  HENT: Negative for congestion, sore throat, rhinorrhea, sneezing, trouble swallowing and sinus pressure.  Eyes: Negative for photophobia and visual disturbance.  Respiratory: Negative for cough, chest tightness, shortness of breath, wheezing and stridor.  Cardiovascular: Negative for chest pain, palpitations and leg swelling.  Gastrointestinal: Negative for nausea, vomiting, abdominal pain, diarrhea, constipation, blood in stool, abdominal distention and anal bleeding.  Genitourinary: Negative for dysuria, hematuria, flank pain and difficulty urinating.  Musculoskeletal: Negative for myalgias, back pain, joint swelling, arthralgias and gait problem.  Skin: Negative for color change, pallor, rash and wound.  Neurological: Negative for dizziness, tremors, weakness and light-headedness.  Hematological: Negative for adenopathy. Does not bruise/bleed easily.  Psychiatric/Behavioral: Negative for behavioral problems, confusion, sleep disturbance, dysphoric mood, decreased concentration and agitation.        Objective:   Physical Exam BP 149/111  Pulse 109  Temp(Src) 98.4 F (36.9 C) (Oral)  Wt 174 lb (78.926 kg)  BMI 29.85 kg/m2  LMP 05/14/2012 Physical Exam  Constitutional:  oriented to person, place, and time. appears well-developed and well-nourished. No distress.  HENT:  Mouth/Throat: Oropharynx is clear and moist. No oropharyngeal exudate.  Cardiovascular: Normal rate, regular rhythm and normal heart sounds. Exam reveals no gallop and no friction rub.  No murmur heard.  Pulmonary/Chest: Effort normal and breath sounds normal.  No respiratory distress. no wheezes.  Abdominal: Soft.  csection scar well healed, no signs of keloid or induration suggestive of infection Lymphadenopathy: no cervical adenopathy.  Neurological:  alert and oriented to person, place, and time.  Skin: Skin is warm and dry. No rash noted. No erythema.  Psychiatric:  a normal mood and affect.  behavior is normal.       Assessment & Plan:  HIV = continue with stribild  Health maintenance = will give flu vaccination  rtc in 3 months

## 2013-01-06 LAB — HIV-1 RNA QUANT-NO REFLEX-BLD: HIV 1 RNA Quant: 23 copies/mL — ABNORMAL HIGH (ref <20)

## 2013-01-06 LAB — CBC WITH DIFFERENTIAL/PLATELET
Eosinophils Relative: 2 % (ref 0–5)
HCT: 34.2 % — ABNORMAL LOW (ref 36.0–46.0)
Hemoglobin: 11 g/dL — ABNORMAL LOW (ref 12.0–15.0)
Lymphocytes Relative: 24 % (ref 12–46)
Lymphs Abs: 1.2 10*3/uL (ref 0.7–4.0)
MCV: 83 fL (ref 78.0–100.0)
Monocytes Absolute: 0.5 10*3/uL (ref 0.1–1.0)
RBC: 4.12 MIL/uL (ref 3.87–5.11)
WBC: 5.2 10*3/uL (ref 4.0–10.5)

## 2013-01-06 LAB — BASIC METABOLIC PANEL WITH GFR
BUN: 9 mg/dL (ref 6–23)
CO2: 28 mEq/L (ref 19–32)
Chloride: 101 mEq/L (ref 96–112)
Creat: 0.76 mg/dL (ref 0.50–1.10)
Potassium: 4.4 mEq/L (ref 3.5–5.3)

## 2013-01-06 LAB — T-HELPER CELL (CD4) - (RCID CLINIC ONLY)
CD4 % Helper T Cell: 40 % (ref 33–55)
CD4 T Cell Abs: 540 /uL (ref 400–2700)

## 2013-03-05 NOTE — Progress Notes (Unsigned)
Patient ID: Diane Lowery, female   DOB: 1980/04/06, 32 y.o.   MRN: [id number] CM closed client for case management services with [facility] due to client moving back to [place] with her husband. CM provided ct. With resources for care near [place], [place] where she will be residing.

## 2013-04-08 ENCOUNTER — Encounter: Payer: Self-pay | Admitting: *Deleted

## 2013-04-08 ENCOUNTER — Ambulatory Visit: Payer: Medicaid Other | Admitting: Internal Medicine

## 2013-04-08 NOTE — Progress Notes (Signed)
Patient ID: Diane MeagerMichelle N Lowery, female   DOB: May 22, 1980, 33 y.o.   MRN: [id number] Per Dr. Drue SecondSnider [place] no need to reschedule

## 2013-05-28 ENCOUNTER — Encounter (HOSPITAL_COMMUNITY): Payer: Self-pay | Admitting: *Deleted

## 2013-08-19 ENCOUNTER — Encounter: Payer: Self-pay | Admitting: *Deleted

## 2013-08-19 NOTE — Progress Notes (Signed)
Patient ID: Diane Lowery, female   DOB: 16-Mar-1981, 33 y.o.   MRN: [id number] Received ROI from  for pt.  Pt moved to [place], [place].

## 2014-01-24 ENCOUNTER — Encounter (HOSPITAL_COMMUNITY): Payer: Self-pay | Admitting: *Deleted
